# Patient Record
Sex: Female | Born: 1945 | Race: White | Hispanic: No | Marital: Single | Smoking: Former smoker
Health system: Southern US, Community
[De-identification: ages and names within clinical notes are randomized; demographics above are authoritative.]

## PROBLEM LIST (undated history)

## (undated) DIAGNOSIS — I509 Heart failure, unspecified: Secondary | ICD-10-CM

## (undated) DIAGNOSIS — J45909 Unspecified asthma, uncomplicated: Secondary | ICD-10-CM

## (undated) DIAGNOSIS — I4891 Unspecified atrial fibrillation: Secondary | ICD-10-CM

## (undated) DIAGNOSIS — I25708 Atherosclerosis of coronary artery bypass graft(s), unspecified, with other forms of angina pectoris: Secondary | ICD-10-CM

## (undated) HISTORY — PX: CORONARY ARTERY BYPASS GRAFT: SHX141

---

## 2017-12-09 ENCOUNTER — Inpatient Hospital Stay (HOSPITAL_BASED_OUTPATIENT_CLINIC_OR_DEPARTMENT_OTHER)
Admission: EM | Admit: 2017-12-09 | Discharge: 2017-12-13 | DRG: 308 | Disposition: A | Discharge status: Home / Self Care | Payer: Medicare Other | Attending: Internal Medicine | Admitting: Internal Medicine

## 2017-12-09 ENCOUNTER — Inpatient Hospital Stay (HOSPITAL_COMMUNITY): Payer: Medicare Other

## 2017-12-09 ENCOUNTER — Other Ambulatory Visit: Payer: Self-pay

## 2017-12-09 ENCOUNTER — Encounter (HOSPITAL_BASED_OUTPATIENT_CLINIC_OR_DEPARTMENT_OTHER): Payer: Self-pay | Admitting: Adult Health

## 2017-12-09 ENCOUNTER — Emergency Department (HOSPITAL_BASED_OUTPATIENT_CLINIC_OR_DEPARTMENT_OTHER): Payer: Medicare Other

## 2017-12-09 DIAGNOSIS — I5033 Acute on chronic diastolic (congestive) heart failure: Secondary | ICD-10-CM | POA: Diagnosis present

## 2017-12-09 DIAGNOSIS — K219 Gastro-esophageal reflux disease without esophagitis: Secondary | ICD-10-CM | POA: Diagnosis present

## 2017-12-09 DIAGNOSIS — Z91012 Allergy to eggs: Secondary | ICD-10-CM

## 2017-12-09 DIAGNOSIS — Z7982 Long term (current) use of aspirin: Secondary | ICD-10-CM

## 2017-12-09 DIAGNOSIS — E669 Obesity, unspecified: Secondary | ICD-10-CM | POA: Diagnosis present

## 2017-12-09 DIAGNOSIS — I4891 Unspecified atrial fibrillation: Secondary | ICD-10-CM | POA: Diagnosis not present

## 2017-12-09 DIAGNOSIS — I480 Paroxysmal atrial fibrillation: Principal | ICD-10-CM | POA: Diagnosis present

## 2017-12-09 DIAGNOSIS — E876 Hypokalemia: Secondary | ICD-10-CM | POA: Diagnosis present

## 2017-12-09 DIAGNOSIS — Z820 Family history of epilepsy and other diseases of the nervous system: Secondary | ICD-10-CM

## 2017-12-09 DIAGNOSIS — R000 Tachycardia, unspecified: Secondary | ICD-10-CM | POA: Diagnosis present

## 2017-12-09 DIAGNOSIS — J40 Bronchitis, not specified as acute or chronic: Secondary | ICD-10-CM | POA: Diagnosis present

## 2017-12-09 DIAGNOSIS — E785 Hyperlipidemia, unspecified: Secondary | ICD-10-CM | POA: Diagnosis present

## 2017-12-09 DIAGNOSIS — Z951 Presence of aortocoronary bypass graft: Secondary | ICD-10-CM | POA: Diagnosis not present

## 2017-12-09 DIAGNOSIS — R05 Cough: Secondary | ICD-10-CM | POA: Diagnosis present

## 2017-12-09 DIAGNOSIS — J4540 Moderate persistent asthma, uncomplicated: Secondary | ICD-10-CM | POA: Diagnosis present

## 2017-12-09 DIAGNOSIS — R7303 Prediabetes: Secondary | ICD-10-CM | POA: Diagnosis present

## 2017-12-09 DIAGNOSIS — I130 Hypertensive heart and chronic kidney disease with heart failure and stage 1 through stage 4 chronic kidney disease, or unspecified chronic kidney disease: Secondary | ICD-10-CM | POA: Diagnosis present

## 2017-12-09 DIAGNOSIS — G4733 Obstructive sleep apnea (adult) (pediatric): Secondary | ICD-10-CM | POA: Diagnosis present

## 2017-12-09 DIAGNOSIS — R739 Hyperglycemia, unspecified: Secondary | ICD-10-CM | POA: Diagnosis present

## 2017-12-09 DIAGNOSIS — R51 Headache: Secondary | ICD-10-CM

## 2017-12-09 DIAGNOSIS — I083 Combined rheumatic disorders of mitral, aortic and tricuspid valves: Secondary | ICD-10-CM | POA: Diagnosis present

## 2017-12-09 DIAGNOSIS — Z79899 Other long term (current) drug therapy: Secondary | ICD-10-CM

## 2017-12-09 DIAGNOSIS — F419 Anxiety disorder, unspecified: Secondary | ICD-10-CM | POA: Diagnosis present

## 2017-12-09 DIAGNOSIS — R748 Abnormal levels of other serum enzymes: Secondary | ICD-10-CM | POA: Diagnosis not present

## 2017-12-09 DIAGNOSIS — F0390 Unspecified dementia without behavioral disturbance: Secondary | ICD-10-CM | POA: Diagnosis present

## 2017-12-09 DIAGNOSIS — F329 Major depressive disorder, single episode, unspecified: Secondary | ICD-10-CM | POA: Diagnosis present

## 2017-12-09 DIAGNOSIS — K8020 Calculus of gallbladder without cholecystitis without obstruction: Secondary | ICD-10-CM | POA: Diagnosis present

## 2017-12-09 DIAGNOSIS — R058 Other specified cough: Secondary | ICD-10-CM

## 2017-12-09 DIAGNOSIS — N183 Chronic kidney disease, stage 3 (moderate): Secondary | ICD-10-CM | POA: Diagnosis present

## 2017-12-09 DIAGNOSIS — Z8249 Family history of ischemic heart disease and other diseases of the circulatory system: Secondary | ICD-10-CM

## 2017-12-09 DIAGNOSIS — R7989 Other specified abnormal findings of blood chemistry: Secondary | ICD-10-CM | POA: Diagnosis present

## 2017-12-09 DIAGNOSIS — R0789 Other chest pain: Secondary | ICD-10-CM | POA: Diagnosis not present

## 2017-12-09 DIAGNOSIS — Z6833 Body mass index (BMI) 33.0-33.9, adult: Secondary | ICD-10-CM | POA: Diagnosis not present

## 2017-12-09 DIAGNOSIS — I2510 Atherosclerotic heart disease of native coronary artery without angina pectoris: Secondary | ICD-10-CM | POA: Diagnosis present

## 2017-12-09 DIAGNOSIS — R0600 Dyspnea, unspecified: Secondary | ICD-10-CM

## 2017-12-09 DIAGNOSIS — I25810 Atherosclerosis of coronary artery bypass graft(s) without angina pectoris: Secondary | ICD-10-CM | POA: Diagnosis present

## 2017-12-09 DIAGNOSIS — Z87891 Personal history of nicotine dependence: Secondary | ICD-10-CM

## 2017-12-09 DIAGNOSIS — R519 Headache, unspecified: Secondary | ICD-10-CM | POA: Diagnosis present

## 2017-12-09 DIAGNOSIS — J45909 Unspecified asthma, uncomplicated: Secondary | ICD-10-CM | POA: Diagnosis present

## 2017-12-09 DIAGNOSIS — I340 Nonrheumatic mitral (valve) insufficiency: Secondary | ICD-10-CM | POA: Diagnosis not present

## 2017-12-09 DIAGNOSIS — Z7901 Long term (current) use of anticoagulants: Secondary | ICD-10-CM

## 2017-12-09 HISTORY — DX: Atherosclerosis of coronary artery bypass graft(s), unspecified, with other forms of angina pectoris: I25.708

## 2017-12-09 HISTORY — DX: Unspecified asthma, uncomplicated: J45.909

## 2017-12-09 HISTORY — DX: Heart failure, unspecified: I50.9

## 2017-12-09 HISTORY — DX: Unspecified atrial fibrillation: I48.91

## 2017-12-09 LAB — COMPREHENSIVE METABOLIC PANEL WITH GFR
ALT: 43 U/L (ref 14–54)
AST: 52 U/L — ABNORMAL HIGH (ref 15–41)
Albumin: 4.1 g/dL (ref 3.5–5.0)
Alkaline Phosphatase: 150 U/L — ABNORMAL HIGH (ref 38–126)
Anion gap: 10 (ref 5–15)
BUN: 11 mg/dL (ref 6–20)
CO2: 22 mmol/L (ref 22–32)
Calcium: 9.6 mg/dL (ref 8.9–10.3)
Chloride: 105 mmol/L (ref 101–111)
Creatinine, Ser: 0.85 mg/dL (ref 0.44–1.00)
GFR calc Af Amer: >60 mL/min (ref >60)
GFR calc non Af Amer: >60 mL/min (ref >60)
Glucose, Bld: 91 mg/dL (ref 65–99)
Potassium: 3.7 mmol/L (ref 3.5–5.1)
Sodium: 137 mmol/L (ref 135–145)
Total Bilirubin: 1.9 mg/dL — ABNORMAL HIGH (ref 0.3–1.2)
Total Protein: 7.4 g/dL (ref 6.5–8.1)

## 2017-12-09 LAB — TROPONIN I
Troponin I: <0.03 ng/mL (ref <0.03)
Troponin I: <0.03 ng/mL (ref <0.03)

## 2017-12-09 LAB — CBC
HCT: 41.3 % (ref 36.0–46.0)
Hemoglobin: 13.9 g/dL (ref 12.0–15.0)
MCH: 27.8 pg (ref 26.0–34.0)
MCHC: 33.7 g/dL (ref 30.0–36.0)
MCV: 82.6 fL (ref 78.0–100.0)
Platelets: 306 K/uL (ref 150–400)
RBC: 5 MIL/uL (ref 3.87–5.11)
RDW: 18.9 % — ABNORMAL HIGH (ref 11.5–15.5)
WBC: 14.7 K/uL — ABNORMAL HIGH (ref 4.0–10.5)

## 2017-12-09 LAB — PROTIME-INR
INR: 1.12
Prothrombin Time: 14.3 s (ref 11.4–15.2)

## 2017-12-09 LAB — MAGNESIUM: Magnesium: 1.9 mg/dL (ref 1.7–2.4)

## 2017-12-09 LAB — BRAIN NATRIURETIC PEPTIDE: B NATRIURETIC PEPTIDE 5: 156 pg/mL — AB (ref 0.0–100.0)

## 2017-12-09 LAB — D-DIMER, QUANTITATIVE (NOT AT ARMC): D DIMER QUANT: 0.48 ug{FEU}/mL (ref 0.00–0.50)

## 2017-12-09 LAB — HEMOGLOBIN A1C
HEMOGLOBIN A1C: 6 % — AB (ref 4.8–5.6)
MEAN PLASMA GLUCOSE: 125.5 mg/dL

## 2017-12-09 MED ORDER — PAROXETINE HCL 10 MG PO TABS
10.0000 mg | ORAL_TABLET | Freq: Every day | ORAL | Status: DC
  Administered 2017-12-10 – 2017-12-13 (×4): 10 mg via ORAL
  Filled 2017-12-09 (×4): qty 1

## 2017-12-09 MED ORDER — MEMANTINE HCL ER 7 MG PO CP24
7.0000 mg | ORAL_CAPSULE | Freq: Every day | ORAL | Status: DC
  Administered 2017-12-10 – 2017-12-13 (×4): 7 mg via ORAL
  Filled 2017-12-09 (×4): qty 1

## 2017-12-09 MED ORDER — ONDANSETRON HCL 4 MG/2ML IJ SOLN
4.0000 mg | Freq: Four times a day (QID) | INTRAMUSCULAR | Status: DC | PRN
  Administered 2017-12-09: 4 mg via INTRAVENOUS
  Filled 2017-12-09: qty 2

## 2017-12-09 MED ORDER — FAMOTIDINE 20 MG PO TABS
20.0000 mg | ORAL_TABLET | Freq: Every day | ORAL | Status: DC
  Administered 2017-12-10 – 2017-12-13 (×4): 20 mg via ORAL
  Filled 2017-12-09 (×4): qty 1

## 2017-12-09 MED ORDER — ACETAMINOPHEN 650 MG RE SUPP: 650.0000 mg | Freq: Four times a day (QID) | RECTAL | Status: DC | PRN

## 2017-12-09 MED ORDER — LORATADINE 10 MG PO TABS
10.0000 mg | ORAL_TABLET | Freq: Every day | ORAL | Status: DC
  Administered 2017-12-10 – 2017-12-13 (×4): 10 mg via ORAL
  Filled 2017-12-09 (×4): qty 1

## 2017-12-09 MED ORDER — AZITHROMYCIN 500 MG IV SOLR
500.0000 mg | INTRAVENOUS | Status: DC
  Administered 2017-12-10 – 2017-12-11 (×2): 500 mg via INTRAVENOUS
  Filled 2017-12-09 (×2): qty 500

## 2017-12-09 MED ORDER — FUROSEMIDE 40 MG PO TABS
40.0000 mg | ORAL_TABLET | Freq: Two times a day (BID) | ORAL | Status: DC
  Administered 2017-12-09 – 2017-12-11 (×4): 40 mg via ORAL
  Filled 2017-12-09 (×4): qty 1

## 2017-12-09 MED ORDER — ZOLPIDEM TARTRATE 5 MG PO TABS
5.0000 mg | ORAL_TABLET | Freq: Once | ORAL | Status: AC
  Administered 2017-12-09: 5 mg via ORAL
  Filled 2017-12-09: qty 1

## 2017-12-09 MED ORDER — DEXTROSE 5 % IV SOLN
5.0000 mg/h | INTRAVENOUS | Status: DC
  Administered 2017-12-10: 5 mg/h via INTRAVENOUS
  Filled 2017-12-09 (×2): qty 100

## 2017-12-09 MED ORDER — LORATADINE 10 MG PO TABS: 10.0000 mg | ORAL_TABLET | Freq: Every day | ORAL | Status: DC

## 2017-12-09 MED ORDER — METOPROLOL TARTRATE 50 MG PO TABS
50.0000 mg | ORAL_TABLET | Freq: Two times a day (BID) | ORAL | Status: DC
  Administered 2017-12-09 – 2017-12-13 (×8): 50 mg via ORAL
  Filled 2017-12-09 (×8): qty 1

## 2017-12-09 MED ORDER — ACETAMINOPHEN 325 MG PO TABS
650.0000 mg | ORAL_TABLET | Freq: Four times a day (QID) | ORAL | Status: DC | PRN
  Administered 2017-12-09 – 2017-12-11 (×2): 650 mg via ORAL
  Filled 2017-12-09 (×2): qty 2

## 2017-12-09 MED ORDER — ATORVASTATIN CALCIUM 80 MG PO TABS
80.0000 mg | ORAL_TABLET | Freq: Every day | ORAL | Status: DC
  Administered 2017-12-09 – 2017-12-10 (×2): 80 mg via ORAL
  Filled 2017-12-09 (×2): qty 1

## 2017-12-09 MED ORDER — APIXABAN 2.5 MG PO TABS
2.5000 mg | ORAL_TABLET | Freq: Two times a day (BID) | ORAL | Status: DC
  Administered 2017-12-09 – 2017-12-10 (×3): 2.5 mg via ORAL
  Filled 2017-12-09 (×3): qty 1

## 2017-12-09 MED ORDER — SODIUM CHLORIDE 0.9 % IV SOLN
1.0000 g | INTRAVENOUS | Status: DC
  Administered 2017-12-10 – 2017-12-11 (×2): 1 g via INTRAVENOUS
  Filled 2017-12-09 (×3): qty 10

## 2017-12-09 MED ORDER — AZITHROMYCIN 500 MG IV SOLR
INTRAVENOUS | Status: AC
  Filled 2017-12-09: qty 500

## 2017-12-09 MED ORDER — MONTELUKAST SODIUM 10 MG PO TABS
10.0000 mg | ORAL_TABLET | Freq: Every day | ORAL | Status: DC
  Administered 2017-12-09 – 2017-12-13 (×5): 10 mg via ORAL
  Filled 2017-12-09 (×5): qty 1

## 2017-12-09 MED ORDER — DOCUSATE SODIUM 100 MG PO CAPS
100.0000 mg | ORAL_CAPSULE | Freq: Two times a day (BID) | ORAL | Status: DC
  Administered 2017-12-09 – 2017-12-13 (×8): 100 mg via ORAL
  Filled 2017-12-09 (×8): qty 1

## 2017-12-09 MED ORDER — FLUTICASONE FUROATE-VILANTEROL 100-25 MCG/INH IN AEPB
1.0000 | INHALATION_SPRAY | Freq: Every day | RESPIRATORY_TRACT | Status: DC
  Administered 2017-12-10 – 2017-12-13 (×4): 1 via RESPIRATORY_TRACT
  Filled 2017-12-09 (×2): qty 28

## 2017-12-09 MED ORDER — ONDANSETRON HCL 4 MG PO TABS: 4.0000 mg | ORAL_TABLET | Freq: Four times a day (QID) | ORAL | Status: DC | PRN

## 2017-12-09 MED ORDER — SODIUM CHLORIDE 0.9 % IV SOLN
500.0000 mg | Freq: Once | INTRAVENOUS | Status: AC
  Administered 2017-12-09: 500 mg via INTRAVENOUS
  Filled 2017-12-09: qty 500

## 2017-12-09 MED ORDER — LEVALBUTEROL HCL 0.63 MG/3ML IN NEBU: 0.6300 mg | INHALATION_SOLUTION | Freq: Four times a day (QID) | RESPIRATORY_TRACT | Status: DC | PRN

## 2017-12-09 MED ORDER — GUAIFENESIN-DM 100-10 MG/5ML PO SYRP
5.0000 mL | ORAL_SOLUTION | ORAL | Status: DC | PRN
  Administered 2017-12-09 – 2017-12-10 (×4): 5 mL via ORAL
  Filled 2017-12-09 (×4): qty 5

## 2017-12-09 MED ORDER — SODIUM CHLORIDE 0.9 % IV SOLN
1.0000 g | Freq: Once | INTRAVENOUS | Status: AC
  Administered 2017-12-09: 1 g via INTRAVENOUS
  Filled 2017-12-09: qty 10

## 2017-12-09 MED ORDER — DILTIAZEM LOAD VIA INFUSION
10.0000 mg | Freq: Once | INTRAVENOUS | Status: AC
  Administered 2017-12-09: 10 mg via INTRAVENOUS
  Filled 2017-12-09: qty 10

## 2017-12-09 MED ORDER — ASPIRIN EC 81 MG PO TBEC
81.0000 mg | DELAYED_RELEASE_TABLET | Freq: Every day | ORAL | Status: DC
  Administered 2017-12-10 – 2017-12-13 (×4): 81 mg via ORAL
  Filled 2017-12-09 (×4): qty 1

## 2017-12-09 MED ORDER — DILTIAZEM LOAD VIA INFUSION
15.0000 mg | Freq: Once | INTRAVENOUS | Status: AC
  Administered 2017-12-09: 15 mg via INTRAVENOUS
  Filled 2017-12-09: qty 15

## 2017-12-09 NOTE — Care Management Note (Signed)
Pleasant 72 year old female visiting from [place] presents with chills cough and shortness of breath to outside urgent care center.  She was noted to be in atrial fibrillation with rapid ventricular response and sent to [facility] emergency department.  Her past medical history is significant for CABG in the fall 2018 due to coronary artery disease it was complicated by atrial fibrillation postoperatively.  Since that time she has not had any atrial fibrillation but is anticoagulated on warfarin we believe however her medication list shows both Eliquis and warfarin and we are checking into this.  Emergency department she was found to be in atrial fibrillation with rapid ventricular response in the 140s to 150s.  She received a bolus of Cardizem at 15 mg and a drip at 5 however she did not [place] currently respond her heart rate remained in the 110s 120s and upon conversation with me the emergency department physician was increasing her drip and giving her an additional 10 mg IV Cardizem bolus.  She will be transferred here to a stepdown bed for further evaluation and management.  Please note that she is having cough and congestion and she has an elevated white blood cell count.  She is running a low-grade fever of 99.8.  Chest x-ray does not show any pneumonia but there is enough of a concern that antibiotics are being started in the emergency department.

## 2017-12-09 NOTE — ED Triage Notes (Signed)
Pt is a heart patient with HX of open heart surgery, she had 6 CABG in Novemebr. She has hx of Afib and is in Afib with RVR of high 170.  she reports a worsening cough and feeling hot over past week. PRoductive cough noted with phlegm. Denies increased swelling or weight. Hot to touch.

## 2017-12-09 NOTE — Progress Notes (Signed)
ANTICOAGULATION CONSULT NOTE - Initial Consult  Pharmacy Consult for apixaban Indication: atrial fibrillation  Allergies  Allergen Reactions  . Eggs Or Egg-Derived Products     Patient Measurements: Height:  (157.5 cm) Weight: 186 lb 1.1 oz (84.4 kg) IBW/kg (Calculated) : 50.1  Vital Signs: Temp: 97.4 F (36.3 C) (05/02 2009) Temp Source: Oral (05/02 2009) BP: 131/74 (05/02 2009) Pulse Rate: 113 (05/02 2009)  Labs: Recent Labs    12/09/17 1453  HGB 13.9  HCT 41.3  PLT 306  LABPROT 14.3  INR 1.12  CREATININE 0.85  TROPONINI <0.03    Estimated Creatinine Clearance: 58.5 mL/min (by C-G formula based on SCr of 0.85 mg/dL).   Medical History: Past Medical History:  Diagnosis Date  . Asthma   . 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 Atrial fibrillation (HCC)   . Congestive heart failure (CHF) (HCC)     Medications:  Medications Prior to Admission  Medication Sig Dispense Refill Last Dose  . apixaban (ELIQUIS) 2.5 MG TABS tablet Take by mouth 2 (two) times daily.    12/09/2017 at Unknown time  . aspirin EC 81 MG tablet Take 81 mg by mouth daily.   12/09/2017 at Unknown time  . atorvastatin (LIPITOR) 80 MG tablet ONE TABLET BY MOUTH IN THE EVENING  0 12/08/2017 at Unknown time  . BREO ELLIPTA 100-25 MCG/INH AEPB 1 puff daily.    12/09/2017 at Unknown time  . cetirizine (ZYRTEC) 10 MG tablet Take 10 mg by mouth daily.   12/09/2017 at Unknown time  . docusate sodium (COLACE) 100 MG capsule Take 100 mg by mouth 2 (two) times daily.   12/09/2017 at Unknown time  . famotidine (PEPCID) 20 MG tablet Take 20 mg by mouth daily.  0 12/09/2017 at Unknown time  . fexofenadine (ALLEGRA) 180 MG tablet Take 180 mg by mouth as needed for allergies or rhinitis.     . furosemide (LASIX) 40 MG tablet ONE TABLET BY MOUTH TWICE A DAY  0 12/09/2017 at Unknown time  . loperamide (IMODIUM) 2 MG capsule Take 2 mg by mouth as needed for diarrhea or loose stools.     . memantine  (NAMENDA XR) 7 MG CP24 24 hr capsule ONE CAPSULE BY MOUTH DAILY FOR 7 DAYS THEN INCREASE TO TWO CAPSULES DAILY  2 12/09/2017 at Unknown time  . metoprolol tartrate (LOPRESSOR) 50 MG tablet ONE TABLET BY MOUTH TWICE A DAY  0 12/09/2017 at Unknown time  . montelukast (SINGULAIR) 10 MG tablet Take 10 mg by mouth daily.  0 12/08/2017 at Unknown time  . PARoxetine (PAXIL) 10 MG tablet Take 10 mg by mouth daily.  0 12/09/2017 at Unknown time  . albuterol (PROVENTIL) (2.5 MG/3ML) 0.083% nebulizer solution ONE UNIT DOSE VIA NEBULIZER EVERY SIX HOURS AS NEEDED  6     Assessment: 72 yoF with history of AFib on apixaban PTA. Pt is < 80yo, Weight > 60 kg, SCr < 1.5. Unsure why she is on reduced dose due to lack of history. Will need to follow up if this is appropriate dosing for this patient. CBC wnl. No bleeding noted. Looks like patient may have also been on warfarin in the past.  Goal of Therapy:  Anticoagulation Monitor platelets by anticoagulation protocol: Yes   Plan:  Restart apixaban 2.5 mg PO BID Monitor CBC, renal function, and for signs and symptoms of bleeding Follow up appropriate dosing   Thank you for allowing [place] to participate  in this patients care.  Signe Colt, PharmD Clinical phone for 12/09/2017 from 3:30-10:30p: [phone] If after 10:30p, please call main pharmacy at: [phone] 12/09/2017 9:33 PM

## 2017-12-09 NOTE — ED Notes (Signed)
Pt. Reports she is from a care facility in [place] that she is given her meds on a time frame and she does not know the names of.  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 Pt in no distress and HR has come down with rhythm still A fib

## 2017-12-09 NOTE — ED Provider Notes (Addendum)
[facility] EMERGENCY DEPARTMENT Provider Note   CSN: [id number] Arrival date & time: 12/09/17  1410     History   Chief Complaint Chief Complaint  Patient presents with  . Chest Pain    HPI Shannon Sellers is a 72 y.o. female.  HPI Patient is a 72 year old female with a history of coronary artery bypass grafting who presents to the emergency department with productive cough and chills over the past 4 to 5 days.  She reports shortness of breath with exertion.  She denies new orthopnea.  She has a history of paroxysmal A. fib.  She is on chronic anticoagulation.  She lives in [place] and is currently visiting family locally.  She was seen in urgent care today and sent to the ER for further evaluation when she was found to be in rapid A. fib.  Patient denies palpitations at this time.  She does have palpitations from time to time.  She states is been many months since she has been in atrial fibrillation.   Past Medical History:  Diagnosis Date  . Asthma   . 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 Atrial fibrillation (HCC)   . Congestive heart failure (CHF) (HCC)     There are no active problems to display for this patient.   Past Surgical History:  Procedure Laterality Date  . CORONARY ARTERY BYPASS GRAFT     6 vessels     OB History   None      Home Medications    Prior to Admission medications   Medication Sig Start Date End Date Taking? Authorizing Provider  albuterol (PROVENTIL) (2.5 MG/3ML) 0.083% nebulizer solution ONE UNIT DOSE VIA NEBULIZER EVERY SIX HOURS AS NEEDED 09/08/17   [provider]  apixaban (ELIQUIS) 2.5 MG TABS tablet Take by mouth.    [provider]  atorvastatin (LIPITOR) 80 MG tablet ONE TABLET BY MOUTH IN THE EVENING 11/17/17   [provider]  BREO ELLIPTA 100-25 MCG/INH AEPB  12/07/17   [provider]  ELIQUIS 2.5 MG TABS tablet ONE TABLET BY MOUTH TWICE A DAY 12/07/17    [provider]  famotidine (PEPCID) 20 MG tablet Take 20 mg by mouth daily. 11/17/17   [provider]  furosemide (LASIX) 40 MG tablet ONE TABLET BY MOUTH TWICE A DAY 11/17/17   [provider]  memantine (NAMENDA XR) 7 MG CP24 24 hr capsule ONE CAPSULE BY MOUTH DAILY FOR 7 DAYS THEN INCREASE TO TWO CAPSULES DAILY 11/19/17   [provider]  methylPREDNISolone (MEDROL DOSEPAK) 4 MG TBPK tablet See admin instructions. 09/18/17   [provider]  metoprolol tartrate (LOPRESSOR) 50 MG tablet ONE TABLET BY MOUTH TWICE A DAY 11/17/17   [provider]  montelukast (SINGULAIR) 10 MG tablet Take 10 mg by mouth daily. 11/17/17   [provider]  PARoxetine (PAXIL) 10 MG tablet Take 10 mg by mouth daily. 11/17/17   [provider]    Family History History reviewed. No pertinent family history.  Social History Social History   Tobacco Use  . Smoking status: Not on file  Substance Use Topics  . Alcohol use: Not on file  . Drug use: Not on file     Allergies   Eggs or egg-derived products   Review of Systems Review of Systems  All other systems reviewed and are negative.    Physical Exam Updated Vital Signs BP (!) 121/99   Pulse 78  Temp 99.8 F (37.7 C) (Oral)   Resp 17   SpO2 98%   Physical Exam  Constitutional: She is oriented to person, place, and time. She appears well-developed and well-nourished. No distress.  HENT:  Head: Normocephalic and atraumatic.  Eyes: EOM are normal.  Neck: Normal range of motion.  Cardiovascular:  Tachycardic.  Irregularly irregular.  Pulmonary/Chest: Effort normal and breath sounds normal.  Abdominal: Soft. She exhibits no distension. There is no tenderness.  Musculoskeletal: Normal range of motion. She exhibits edema.  Neurological: She is alert and oriented to person, place, and time.  Skin: Skin is warm and dry.  Psychiatric: She has a normal mood and affect. Judgment  normal.  Nursing note and vitals reviewed.    ED Treatments / Results  Labs (all labs ordered are listed, but only abnormal results are displayed) Labs Reviewed  CBC - Abnormal; Notable for the following components:      Result Value   WBC 14.7 (*)    RDW 18.9 (*)    All other components within normal limits  COMPREHENSIVE METABOLIC PANEL - Abnormal; Notable for the following components:   AST 52 (*)    Alkaline Phosphatase 150 (*)    Total Bilirubin 1.9 (*)    All other components within normal limits  BRAIN NATRIURETIC PEPTIDE - Abnormal; Notable for the following components:   B Natriuretic Peptide 156.0 (*)    All other components within normal limits  TROPONIN I  PROTIME-INR    EKG EKG Interpretation  Date/Time:  Thursday Dec 09 2017 14:16:33 EDT Ventricular Rate:  145 PR Interval:    QRS Duration: 80 QT Interval:  302 QTC Calculation: 469 R Axis:   72 Text Interpretation:  Atrial fibrillation with rapid ventricular response with premature ventricular or aberrantly conducted complexes Nonspecific ST abnormality Abnormal ECG No old tracing to compare Confirmed by Azalia Bilis ([id number]) on 12/09/2017 3:42:13 PM   Radiology Dg Chest Portable 1 View  Result Date: 12/09/2017 CLINICAL DATA:  Productive cough EXAM: PORTABLE CHEST 1 VIEW COMPARISON:  None. FINDINGS: Mild cardiomegaly. Normal vascularity. Postop changes from CABG. No pneumothorax or pleural effusion. Low volumes. IMPRESSION: Cardiomegaly without decompensation. Electronically Signed   By: Jolaine Click M.D.   On: 12/09/2017 15:20    Procedures .Critical Care Performed by: Azalia Bilis, MD Authorized by: Azalia Bilis, MD     CRITICAL CARE Performed by: Azalia Bilis Total critical care time: 32 minutes Critical care time was exclusive of separately billable procedures and treating other patients. Critical care was necessary to treat or prevent imminent or life-threatening deterioration. Critical care  was time spent personally by me on the following activities: development of treatment plan with patient and/or surrogate as well as nursing, discussions with consultants, evaluation of patient's response to treatment, examination of patient, obtaining history from patient or surrogate, ordering and performing treatments and interventions, ordering and review of laboratory studies, ordering and review of radiographic studies, pulse oximetry and re-evaluation of patient's condition.   Medications Ordered in ED Medications  diltiazem (CARDIZEM) 1 mg/mL load via infusion 15 mg (15 mg Intravenous Bolus from Bag 12/09/17 1513)    And  diltiazem (CARDIZEM) 100 mg in dextrose 5 % 100 mL (1 mg/mL) infusion ( Intravenous Rate/Dose Change 12/09/17 1515)  diltiazem (CARDIZEM) 1 mg/mL load via infusion 10 mg (has no administration in time range)  cefTRIAXone (ROCEPHIN) 1 g in sodium chloride 0.9 % 100 mL IVPB (has no administration in time range)  azithromycin (ZITHROMAX) 500  mg in sodium chloride 0.9 % 250 mL IVPB (has no administration in time range)     Initial Impression / Assessment and Plan / ED Course  I have reviewed the triage vital signs and the nursing notes.  Pertinent labs & imaging results that were available during my care of the patient were reviewed by me and considered in my medical decision making (see chart for details).     Patient with rapid atrial fibrillation on arrival to the emergency department.  History of A. fib.  Subtherapeutic INR at 1.12 today.  She also has listed on her medication list Eliquis but she is unsure if she takes Coumadin or Eliquis for anticoagulation.  Remains in rapid A. fib despite Cardizem drip.  We will titrate her Cardizem drip at this time.  Chest pain-free.  She is had productive cough and chills and has an oral temp of 99.8.  Will obtain a rectal temp.  This may represent developing pneumonia.  Patient be covered with antibiotics.  Admit to the  hospital.  Final Clinical Impressions(s) / ED Diagnoses   Final diagnoses:  Atrial fibrillation with tachycardic ventricular rate (HCC)  Dyspnea, unspecified type  Productive cough    ED Discharge Orders    None       Azalia Bilis, MD 12/09/17 1544    Azalia Bilis, MD 12/09/17 [phone]

## 2017-12-09 NOTE — H&P (Signed)
History and Physical    Shannon Sellers AVW:[id number] DOB: [birth year] DOA: 12/09/2017  PCP: Patient, No Pcp Per  Patient coming from: Home.  Chief Complaint: Chest pressure palpitations shortness of breath.  HPI: Shannon Sellers is a 72 y.o. female with history of CAD status post CABG in November 2018 at [place] who is visiting [place] and has been in [place] for last 2 weeks.  Over the last 2 weeks patient has been having flulike symptoms with cough and sinus pressure and sometimes headache.  Patient also has been progressively getting short of breath with some chest pressure off and on with palpitations.  Given the symptoms patient came to the ER.  ED Course: In the ER patient is found to be in A. fib with RVR.  Patient was started on Cardizem infusion.  Patient was mildly febrile with leukocytosis chest x-ray was unremarkable.  Given her respiratory tract symptoms patient was empirically started on antibiotics for possible pneumonia.  Patient admitted for further management.  Review of Systems: As per HPI, rest all negative.   Past Medical History:  Diagnosis Date  . Asthma   . 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 Atrial fibrillation (HCC)   . Congestive heart failure (CHF) [facility])     Past Surgical History:  Procedure Laterality Date  . CORONARY ARTERY BYPASS GRAFT     6 vessels     reports that she has quit smoking. She has never used smokeless tobacco. She reports that she drank alcohol. She reports that she does not use drugs.  Allergies  Allergen Reactions  . Eggs Or Egg-Derived Products     Family History  Problem Relation Age of Onset  . CAD Mother   . Alzheimer's disease Brother     Prior to Admission medications   Medication Sig Start Date End Date Taking? Authorizing Provider  apixaban (ELIQUIS) 2.5 MG TABS tablet Take by mouth 2 (two) times daily.    Yes [provider]  aspirin EC 81 MG tablet Take 81 mg by mouth  daily.   Yes [provider]  atorvastatin (LIPITOR) 80 MG tablet ONE TABLET BY MOUTH IN THE EVENING 11/17/17  Yes [provider]  BREO ELLIPTA 100-25 MCG/INH AEPB 1 puff daily.  12/07/17  Yes [provider]  cetirizine (ZYRTEC) 10 MG tablet Take 10 mg by mouth daily.   Yes [provider]  docusate sodium (COLACE) 100 MG capsule Take 100 mg by mouth 2 (two) times daily.   Yes [provider]  famotidine (PEPCID) 20 MG tablet Take 20 mg by mouth daily. 11/17/17  Yes [provider]  fexofenadine (ALLEGRA) 180 MG tablet Take 180 mg by mouth as needed for allergies or rhinitis.   Yes [provider]  furosemide (LASIX) 40 MG tablet ONE TABLET BY MOUTH TWICE A DAY 11/17/17  Yes [provider]  loperamide (IMODIUM) 2 MG capsule Take 2 mg by mouth as needed for diarrhea or loose stools.   Yes [provider]  memantine (NAMENDA XR) 7 MG CP24 24 hr capsule ONE CAPSULE BY MOUTH DAILY FOR 7 DAYS THEN INCREASE TO TWO CAPSULES DAILY 11/19/17  Yes [provider]  metoprolol tartrate (LOPRESSOR) 50 MG tablet ONE TABLET BY MOUTH TWICE A DAY 11/17/17  Yes [provider]  montelukast (SINGULAIR) 10 MG tablet Take 10 mg by mouth daily. 11/17/17  Yes [provider]  PARoxetine (PAXIL) 10 MG tablet Take 10 mg by mouth  daily. 11/17/17  Yes [provider]  albuterol (PROVENTIL) (2.5 MG/3ML) 0.083% nebulizer solution ONE UNIT DOSE VIA NEBULIZER EVERY SIX HOURS AS NEEDED 09/08/17   [provider]    Physical Exam: Vitals:   12/09/17 1730 12/09/17 1745 12/09/17 1859 12/09/17 2009  BP: 121/64 122/72  131/74  Pulse: 95 91 (!) 104 (!) 113  Resp: 13 (!) Temp:   99.2 F (37.3 C) (!) 97.4 F (36.3 C)  TempSrc:   Oral Oral  SpO2: 99% 100% 99% 96%  Weight:   84.4 kg (186 lb 1.1 oz)   Height:    (1.575 m)       Constitutional: Moderately built and nourished. Vitals:    12/09/17 1730 12/09/17 1745 12/09/17 1859 12/09/17 2009  BP: 121/64 122/72  131/74  Pulse: 95 91 (!) 104 (!) 113  Resp: 13 (!) Temp:   99.2 F (37.3 C) (!) 97.4 F (36.3 C)  TempSrc:   Oral Oral  SpO2: 99% 100% 99% 96%  Weight:   84.4 kg (186 lb 1.1 oz)   Height:    (1.575 m)    Eyes: Anicteric no pallor. ENMT: No discharge from the ears eyes nose or mouth. Neck: No mass palpated no JVD appreciated. Respiratory: No rhonchi or crepitations. Cardiovascular: S1-S2 heard no murmurs appreciated. Abdomen: Soft nontender bowel sounds present.  Musculoskeletal: No edema no joint effusion. Skin: No rash. Neurologic: Awake oriented to time place and person.  Moves all extremities. Psychiatric: Appears normal.   Labs on Admission: I have personally reviewed following labs and imaging studies  CBC: Recent Labs  Lab 12/09/17 1453  WBC 14.7*  HGB 13.9  HCT 41.3  MCV 82.6  PLT 306   Basic Metabolic Panel: Recent Labs  Lab 12/09/17 1453  NA 137  K 3.7  CL 105  CO2 22  GLUCOSE 91  BUN 11  CREATININE 0.85  CALCIUM 9.6   GFR: Estimated Creatinine Clearance: 58.5 mL/min (by C-G formula based on SCr of 0.85 mg/dL). Liver Function Tests: Recent Labs  Lab 12/09/17 1453  AST 52*  ALT 43  ALKPHOS 150*  BILITOT 1.9*  PROT 7.4  ALBUMIN 4.1   No results for input(s): LIPASE, AMYLASE in the last 168 hours. No results for input(s): AMMONIA in the last 168 hours. Coagulation Profile: Recent Labs  Lab 12/09/17 1453  INR 1.12   Cardiac Enzymes: Recent Labs  Lab 12/09/17 1453  TROPONINI <0.03   BNP (last 3 results) No results for input(s): PROBNP in the last 8760 hours. HbA1C: No results for input(s): HGBA1C in the last 72 hours. CBG: No results for input(s): GLUCAP in the last 168 hours. Lipid Profile: No results for input(s): CHOL, HDL, LDLCALC, TRIG, CHOLHDL, LDLDIRECT in the last 72 hours. Thyroid Function Tests: No results for input(s): TSH,  T4TOTAL, FREET4, T3FREE, THYROIDAB in the last 72 hours. Anemia Panel: No results for input(s): VITAMINB12, FOLATE, FERRITIN, TIBC, IRON, RETICCTPCT in the last 72 hours. Urine analysis: No results found for: COLORURINE, APPEARANCEUR, LABSPEC, PHURINE, GLUCOSEU, HGBUR, BILIRUBINUR, KETONESUR, PROTEINUR, UROBILINOGEN, NITRITE, LEUKOCYTESUR Sepsis Labs: (procalcitonin:4,lacticidven:4) )No results found for this or any previous visit (from the past 240 hour(s)).   Radiological Exams on Admission: Dg Chest Portable 1 View  Result Date: 12/09/2017 CLINICAL DATA:  Productive cough EXAM: PORTABLE CHEST 1 VIEW COMPARISON:  None. FINDINGS: Mild cardiomegaly. Normal vascularity. Postop changes from CABG. No pneumothorax or pleural effusion. Low volumes. IMPRESSION: Cardiomegaly  without decompensation. Electronically Signed   By: Jolaine Click M.D.   On: 12/09/2017 15:20    EKG: Independently reviewed.  A. fib with RVR.  I see that in the monitor old.  Assessment/Plan Active Problems:   Atrial fibrillation with RVR (HCC)   Asthma   CAD (coronary artery disease)   Headache   Dyspnea   Productive cough    1. A. fib with RVR -patient had postoperative A. fib after her CABG in November 2018 following which patient was briefly on anticoagulation was discontinued.  Patient presently is in A. fib with RVR given her chads 2 vasc score of at least 2 patient started on apixaban.  Patient on Cardizem infusion.  Patient is due to take her metoprolol.  We will closely observe and stepdown.  Check TSH cardiac markers 2D echo and d-dimer. 2. Fever and cough concerning for pneumonia patient is placed empiric antibiotics.  Check urine for Legionella strep antigen influenza PCR cultures. 3. CAD status post CABG denies any chest pain at this time did have some chest pain off and on last 2 weeks.  We will cycle cardiac markers check 2D echo patient is on aspirin Plavix statins and metoprolol. 4. Possible CHF  patient is on Lasix.  Check 2D echo. 5. Headache -patient states he has been having worsening headache last 2 to 3 days.  Will check CT head and sed rate. 6. History of asthma presently not wheezing. 7. Sleep apnea on CPAP. 8. 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   Will need to get patient's records from [facility].  For further history.  Charts not available in care everywhere.  DVT prophylaxis: Apixaban. Code Status: Full code. Family Communication: No family at the bedside. Disposition Plan: To be determined. Consults called: None. Admission status: Inpatient.   Eduard Clos MD [facility] Pager [phone].  If 7PM-7AM, please contact night-coverage [email or web address] Password [email or web address]  12/09/2017, 9:10 PM

## 2017-12-09 NOTE — Progress Notes (Signed)
Patient's home CPAP was set up at the bedside and is ready for use.  

## 2017-12-10 ENCOUNTER — Other Ambulatory Visit (HOSPITAL_COMMUNITY): Payer: Medicare Other

## 2017-12-10 LAB — URINALYSIS, ROUTINE W REFLEX MICROSCOPIC
BACTERIA UA: NONE SEEN
Bilirubin Urine: NEGATIVE
GLUCOSE, UA: NEGATIVE mg/dL
Hgb urine dipstick: NEGATIVE
KETONES UR: NEGATIVE mg/dL
Nitrite: NEGATIVE
PROTEIN: NEGATIVE mg/dL
Specific Gravity, Urine: 1.018 (ref 1.005–1.030)
pH: 6 (ref 5.0–8.0)

## 2017-12-10 LAB — COMPREHENSIVE METABOLIC PANEL
ALK PHOS: 134 U/L — AB (ref 38–126)
ALT: 41 U/L (ref 14–54)
AST: 49 U/L — AB (ref 15–41)
Albumin: 3.3 g/dL — ABNORMAL LOW (ref 3.5–5.0)
Anion gap: 8 (ref 5–15)
BILIRUBIN TOTAL: 1.7 mg/dL — AB (ref 0.3–1.2)
BUN: 9 mg/dL (ref 6–20)
CO2: 26 mmol/L (ref 22–32)
CREATININE: 1.02 mg/dL — AB (ref 0.44–1.00)
Calcium: 9.2 mg/dL (ref 8.9–10.3)
Chloride: 106 mmol/L (ref 101–111)
GFR calc Af Amer: >60 mL/min (ref >60)
GFR calc non Af Amer: 53 mL/min — ABNORMAL LOW (ref >60)
GLUCOSE: 106 mg/dL — AB (ref 65–99)
Potassium: 4.3 mmol/L (ref 3.5–5.1)
Sodium: 140 mmol/L (ref 135–145)
Total Protein: 6.4 g/dL — ABNORMAL LOW (ref 6.5–8.1)

## 2017-12-10 LAB — CBC
HEMATOCRIT: 38.9 % (ref 36.0–46.0)
Hemoglobin: 12.4 g/dL (ref 12.0–15.0)
MCH: 27.3 pg (ref 26.0–34.0)
MCHC: 31.9 g/dL (ref 30.0–36.0)
MCV: 85.5 fL (ref 78.0–100.0)
Platelets: 277 10*3/uL (ref 150–400)
RBC: 4.55 MIL/uL (ref 3.87–5.11)
RDW: 18.7 % — ABNORMAL HIGH (ref 11.5–15.5)
WBC: 11.7 10*3/uL — ABNORMAL HIGH (ref 4.0–10.5)

## 2017-12-10 LAB — INFLUENZA PANEL BY PCR (TYPE A & B)
Influenza A By PCR: NEGATIVE
Influenza B By PCR: NEGATIVE

## 2017-12-10 LAB — STREP PNEUMONIAE URINARY ANTIGEN: Strep Pneumo Urinary Antigen: NEGATIVE

## 2017-12-10 LAB — TROPONIN I
Troponin I: <0.03 ng/mL (ref <0.03)
Troponin I: <0.03 ng/mL (ref <0.03)

## 2017-12-10 MED ORDER — ACETAMINOPHEN-CODEINE 120-12 MG/5ML PO SOLN: 5.0000 mL | Freq: Four times a day (QID) | ORAL | Status: AC | PRN

## 2017-12-10 MED ORDER — CHLORHEXIDINE GLUCONATE 0.12 % MT SOLN
15.0000 mL | Freq: Two times a day (BID) | OROMUCOSAL | Status: DC
  Administered 2017-12-10 – 2017-12-13 (×5): 15 mL via OROMUCOSAL
  Filled 2017-12-10 (×6): qty 15

## 2017-12-10 MED ORDER — ORAL CARE MOUTH RINSE
15.0000 mL | Freq: Two times a day (BID) | OROMUCOSAL | Status: DC
  Administered 2017-12-12: 15 mL via OROMUCOSAL

## 2017-12-10 MED ORDER — DILTIAZEM HCL 60 MG PO TABS
30.0000 mg | ORAL_TABLET | Freq: Three times a day (TID) | ORAL | Status: DC
  Administered 2017-12-10 – 2017-12-11 (×3): 30 mg via ORAL
  Filled 2017-12-10 (×3): qty 1

## 2017-12-10 MED ORDER — APIXABAN 5 MG PO TABS
5.0000 mg | ORAL_TABLET | Freq: Two times a day (BID) | ORAL | Status: DC
  Administered 2017-12-11 – 2017-12-13 (×5): 5 mg via ORAL
  Filled 2017-12-10 (×5): qty 1

## 2017-12-10 NOTE — Progress Notes (Addendum)
PROGRESS NOTE  Shannon Sellers BJY:[id number] DOB: Jul 22, 1946 DOA: 12/09/2017 PCP: Patient, No Pcp Per  HPI/Recap of past 24 hours: Shannon Sellers is a 72 y.o. female with history of CAD status post CABG in November 2018 at [place] who is visiting [place] and has been in [place] for last 2 weeks.  Over the last 2 weeks patient has been having flulike symptoms with cough and sinus pressure and sometimes headache.  Patient also has been progressively getting short of breath with some chest pressure off and on with palpitations.  Given the symptoms patient came to the ER. Patient is still coughing quite a  lot, cough is mostly nonproductive.  Heart rate is more stable she still on the Cardizem drip   Assessment/Plan: Active Problems:   Atrial fibrillation with RVR (HCC)   Asthma   CAD (coronary artery disease)   Headache   Dyspnea   Productive cough  We will transition her from Cardizem drip to p.o. Cardizem short acting and long-acting Cardizem in the morning Obstructive sleep apnea patient is on CPAP she uses CPAP at home  Code Status: Full  Family Communication: Discussed with patient about the findings and updated her on results and plan  Disposition Plan: Home   Consultants:  None  Procedures:  IV Cardizem drip  Antimicrobials:  Ceftriaxone  DVT prophylaxis: Apixaban   Objective: Vitals:   12/10/17 0454 12/10/17 0515 12/10/17 0816 12/10/17 1150  BP: 109/74  (!) 112/57 (!) 103/58  Pulse: 84     Resp: 15 (!) 32 17 17  Temp: 97.8 F (36.6 C)  98.4 F (36.9 C) 97.7 F (36.5 C)  TempSrc: Axillary  Oral Oral  SpO2: 98%  96% 98%  Weight:  83.7 kg (184 lb 9.6 oz)    Height:        Intake/Output Summary (Last 24 hours) at 12/10/2017 1600 Last data filed at 12/10/2017 0700 Gross per 24 hour  Intake 465 ml  Output 250 ml  Net 215 ml   Filed Weights   12/09/17 1556 12/09/17 1859 12/10/17 0515  Weight: 81.6 kg (180 lb) 84.4 kg (186 lb 1.1 oz) 83.7 kg  (184 lb 9.6 oz)   Body mass index is 33.76 kg/m.  Exam:     (1.575 m)    Eyes: Anicteric no pallor. ENMT: No discharge from the ears eyes nose or mouth. Neck: No mass palpated no JVD appreciated. Respiratory: No rhonchi or crepitations.  Paroxysmal coughing Cardiovascular: S1-S2 heard no murmurs appreciated. Abdomen: Soft nontender bowel sounds present.  Musculoskeletal: No edema no joint effusion. Skin: No rash. Neurologic: Awake oriented to time place and person.  Moves all extremities. Psychiatric: Appears normal.      Data Reviewed: CBC: Recent Labs  Lab 12/09/17 1453 12/10/17 0419  WBC 14.7* 11.7*  HGB 13.9 12.4  HCT 41.3 38.9  MCV 82.6 85.5  PLT 306 277   Basic Metabolic Panel: Recent Labs  Lab 12/09/17 1453 12/09/17 2240 12/10/17 0419  NA 137  --  140  K 3.7  --  4.3  CL 105  --  106  CO2 22  --  26  GLUCOSE 91  --  106*  BUN 11  --  9  CREATININE 0.85  --  1.02*  CALCIUM 9.6  --  9.2  MG  --  1.9  --    GFR: Estimated Creatinine Clearance: 48.5 mL/min (A) (by C-G formula based on SCr of 1.02 mg/dL (H)). Liver Function Tests: Recent Labs  Lab 12/09/17 1453 12/10/17 0419  AST 52* 49*  ALT 43 41  ALKPHOS 150* 134*  BILITOT 1.9* 1.7*  PROT 7.4 6.4*  ALBUMIN 4.1 3.3*   No results for input(s): LIPASE, AMYLASE in the last 168 hours. No results for input(s): AMMONIA in the last 168 hours. Coagulation Profile: Recent Labs  Lab 12/09/17 1453  INR 1.12   Cardiac Enzymes: Recent Labs  Lab 12/09/17 1453 12/09/17 2240 12/10/17 0419 12/10/17 0803  TROPONINI <0.03 <0.03 <0.03 <0.03   BNP (last 3 results) No results for input(s): PROBNP in the last 8760 hours. HbA1C: Recent Labs    12/09/17 2240  HGBA1C 6.0*   CBG: No results for input(s): GLUCAP in the last 168 hours. Lipid Profile: No results for input(s): CHOL, HDL, LDLCALC, TRIG, CHOLHDL, LDLDIRECT in the last 72 hours. Thyroid Function Tests: No results for  input(s): TSH, T4TOTAL, FREET4, T3FREE, THYROIDAB in the last 72 hours. Anemia Panel: No results for input(s): VITAMINB12, FOLATE, FERRITIN, TIBC, IRON, RETICCTPCT in the last 72 hours. Urine analysis:    Component Value Date/Time   COLORURINE YELLOW 12/10/2017 0218   APPEARANCEUR CLEAR 12/10/2017 0218   LABSPEC 1.018 12/10/2017 0218   PHURINE 6.0 12/10/2017 0218   GLUCOSEU NEGATIVE 12/10/2017 0218   HGBUR NEGATIVE 12/10/2017 0218   BILIRUBINUR NEGATIVE 12/10/2017 0218   KETONESUR NEGATIVE 12/10/2017 0218   PROTEINUR NEGATIVE 12/10/2017 0218   NITRITE NEGATIVE 12/10/2017 0218   LEUKOCYTESUR TRACE (A) 12/10/2017 0218   Sepsis Labs: (procalcitonin:4,lacticidven:4)  ) Recent Results (from the past 240 hour(s))  Culture, blood (routine x 2) Call MD if unable to obtain prior to antibiotics being given     Status: None (Preliminary result)   Collection Time: 12/09/17 10:35 PM  Result Value Ref Range Status   Specimen Description   Final    BLOOD RIGHT HAND Performed at [facility], [address] [place], [place] [address]    Special Requests   Final    BOTTLES DRAWN AEROBIC ONLY Blood Culture adequate volume   Culture PENDING  Incomplete   Report Status PENDING  Incomplete  Culture, blood (routine x 2) Call MD if unable to obtain prior to antibiotics being given     Status: None (Preliminary result)   Collection Time: 12/09/17 10:40 PM  Result Value Ref Range Status   Specimen Description   Final    BLOOD RIGHT HAND Performed at [facility], [address], [place], [place] [address]    Special Requests   Final    BOTTLES DRAWN AEROBIC ONLY Blood Culture adequate volume   Culture PENDING  Incomplete   Report Status PENDING  Incomplete      Studies: Ct Head Wo Contrast  Result Date: 12/09/2017 CLINICAL DATA:  72 y/o  F; worsening headache since yesterday. EXAM: CT HEAD WITHOUT CONTRAST TECHNIQUE: Contiguous axial images were obtained from the base  of the skull through the vertex without intravenous contrast. COMPARISON:  None. FINDINGS: Brain: No evidence of acute infarction, hemorrhage, hydrocephalus, extra-axial collection or mass lesion/mass effect. Small lacunar infarcts are present within the right caudate head and thalamus. Mild chronic microvascular ischemic changes and moderate parenchymal volume loss of the brain for age. Vascular: Extensive calcific atherosclerosis of carotid siphons. Skull: Normal. Negative for fracture or focal lesion. Sinuses/Orbits: Mild ethmoid and maxillary sinus mucosal thickening. Normal aeration of mastoid air cells. Bilateral intra-ocular lens replacement. Other: None. IMPRESSION: 1. No acute intracranial abnormality identified. 2. Small chronic lacunar infarcts of  the basal ganglia, mild chronic microvascular ischemic changes, and moderate parenchymal volume loss of the brain. Electronically Signed   By: Mitzi Hansen M.D.   On: 12/09/2017 22:11    Scheduled Meds: . apixaban  2.5 mg Oral BID  . aspirin EC  81 mg Oral Daily  . atorvastatin  80 mg Oral q1800  . chlorhexidine  15 mL Mouth Rinse BID  . docusate sodium  100 mg Oral BID  . famotidine  20 mg Oral Daily  . fluticasone furoate-vilanterol  1 puff Inhalation Daily  . furosemide  40 mg Oral BID  . loratadine  10 mg Oral Daily  . mouth rinse  15 mL Mouth Rinse q12n4p  . memantine  7 mg Oral Daily  . metoprolol tartrate  50 mg Oral BID  . montelukast  10 mg Oral Daily  . PARoxetine  10 mg Oral Daily    Continuous Infusions: . azithromycin    . cefTRIAXone (ROCEPHIN)  IV    . diltiazem (CARDIZEM) infusion 5 mg/hr (12/10/17 0106)     LOS: 1 day     Myrtie Neither, MD [facility]  To reach me or the doctor on call, go to: [email or web address] Password [email or web address]  12/10/2017, 4:00 PM

## 2017-12-10 NOTE — Progress Notes (Signed)
The patient is currently on her home CPAP

## 2017-12-10 NOTE — Care Management Note (Signed)
Case Management Note  Patient Details  Name: Semya Klinke MRN: [id number] Date of Birth: November 27, 1945  Subjective/Objective:                 Patient from [facility], admitted w Afib. Patient provided PCP name as Janann August and Duane Boston [phone] [address] [place] [address]. Patient lives at [facility] of [address] through [employer] [facility] [employer]. Patient is independent, no HH needs identified. Has been on Eliquis in the past and denies difficulties with copays.  No CM needs identified at this time.    Action/Plan:   Expected Discharge Date:                  Expected Discharge Plan:  Home/Self Care  In-House Referral:     Discharge planning Services  CM Consult  Post Acute Care Choice:    Choice offered to:     DME Arranged:    DME Agency:     HH Arranged:    HH Agency:     Status of Service:  In process, will continue to follow  If discussed at Long Length of Stay Meetings, dates discussed:    Additional Comments:  Lawerance Sabal, RN 12/10/2017, 3:57 PM

## 2017-12-11 ENCOUNTER — Inpatient Hospital Stay (HOSPITAL_COMMUNITY): Payer: Medicare Other

## 2017-12-11 DIAGNOSIS — I4891 Unspecified atrial fibrillation: Secondary | ICD-10-CM

## 2017-12-11 DIAGNOSIS — I340 Nonrheumatic mitral (valve) insufficiency: Secondary | ICD-10-CM

## 2017-12-11 LAB — CBC WITH DIFFERENTIAL/PLATELET
BASOS ABS: 0.1 10*3/uL (ref 0.0–0.1)
BASOS PCT: 0 %
EOS PCT: 5 %
Eosinophils Absolute: 0.7 10*3/uL (ref 0.0–0.7)
HEMATOCRIT: 38.3 % (ref 36.0–46.0)
Hemoglobin: 12.3 g/dL (ref 12.0–15.0)
Lymphocytes Relative: 8 %
Lymphs Abs: 1.2 10*3/uL (ref 0.7–4.0)
MCH: 27.3 pg (ref 26.0–34.0)
MCHC: 32.1 g/dL (ref 30.0–36.0)
MCV: 85.1 fL (ref 78.0–100.0)
MONO ABS: 1.7 10*3/uL — AB (ref 0.1–1.0)
Monocytes Relative: 12 %
NEUTROS ABS: 10.9 10*3/uL — AB (ref 1.7–7.7)
Neutrophils Relative %: 75 %
PLATELETS: 262 10*3/uL (ref 150–400)
RBC: 4.5 MIL/uL (ref 3.87–5.11)
RDW: 18.4 % — AB (ref 11.5–15.5)
WBC: 14.6 10*3/uL — ABNORMAL HIGH (ref 4.0–10.5)

## 2017-12-11 LAB — BASIC METABOLIC PANEL
ANION GAP: 11 (ref 5–15)
BUN: 9 mg/dL (ref 6–20)
CALCIUM: 9 mg/dL (ref 8.9–10.3)
CO2: 25 mmol/L (ref 22–32)
CREATININE: 0.94 mg/dL (ref 0.44–1.00)
Chloride: 99 mmol/L — ABNORMAL LOW (ref 101–111)
GFR, EST NON AFRICAN AMERICAN: 58 mL/min — AB (ref >60)
Glucose, Bld: 104 mg/dL — ABNORMAL HIGH (ref 65–99)
Potassium: 4 mmol/L (ref 3.5–5.1)
Sodium: 135 mmol/L (ref 135–145)

## 2017-12-11 LAB — ECHOCARDIOGRAM COMPLETE
HEIGHTINCHES: 62 in
WEIGHTICAEL: 2953.6 [oz_av]

## 2017-12-11 LAB — LEGIONELLA PNEUMOPHILA SEROGP 1 UR AG: L. PNEUMOPHILA SEROGP 1 UR AG: NEGATIVE

## 2017-12-11 MED ORDER — DILTIAZEM HCL 60 MG PO TABS
60.0000 mg | ORAL_TABLET | Freq: Three times a day (TID) | ORAL | Status: DC
  Administered 2017-12-11 – 2017-12-12 (×3): 60 mg via ORAL
  Filled 2017-12-11 (×3): qty 1

## 2017-12-11 MED ORDER — METHYLPREDNISOLONE SODIUM SUCC 40 MG IJ SOLR
40.0000 mg | Freq: Two times a day (BID) | INTRAMUSCULAR | Status: DC
  Administered 2017-12-11 – 2017-12-12 (×3): 40 mg via INTRAVENOUS
  Filled 2017-12-11 (×3): qty 1

## 2017-12-11 NOTE — Progress Notes (Signed)
Pt placed on home CPAP for the night with nasal mask.  Tolerating well.

## 2017-12-11 NOTE — Consult Note (Signed)
CARDIOLOGY CONSULT NOTE  Patient ID: Shannon Sellers MRN: [id number] DOB/AGE: [birth year] 74 y.o.  Admit date: 12/09/2017 Referring Physician  Blanchard Mane, MD Primary Physician:  Patient, No Pcp Per Reason for Consultation  A. Fib  HPI: Shannon Sellers  is a 72 y.o. female  With Patient with known coronary artery disease S/P CABG in number 2018 in [place] him a presently visiting [place] visiting her friends.  She has been having generalized weakness, fatigue and probably low grade temperature over the past 2 weeks.  Over the past 2 days she has been incessantly coughing and hence her friends made her go to the urgent care followed by which she was sent over to the hospital for admission the emergency room.  In the emergency room she was found to be atrial fibrillation with RVR which improved with IV diltiazem.  She was also found to be mildly febrile, hence admitted for possible pneumonia and also for atrial fibrillation rate control.  Was the past 2 weeks she has noticed gradual worsening dyspnea.  But denies any PND or orthopnea.  She also states that she has been coughing a lot especially last 2 days and has been bringing up sputum but denies hemoptysis or yellowish sputum.  She is also noticed occasional chest tightness.  Her past medical history significant for atrial fibrillation, which appears to be chronic.  She also has hypertension, hyperlipidemia, GERD, anxiety and depression.  I was consulted for atrial fibrillation management.  Presently patient states that she is feeling better but states that if she walks any distance she will start feeling more short of breath.  Denies any chest pain.  Past Medical History:  Diagnosis Date  . Asthma   . 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 Atrial fibrillation (HCC)   . Congestive heart failure (CHF) [facility])      Past Surgical History:  Procedure Laterality Date  . CORONARY ARTERY BYPASS GRAFT     6 vessels      Family History  Problem Relation Age of Onset  . CAD Mother   . Alzheimer's disease Brother      Social History: Social History   Socioeconomic History  . Marital status: Single    Spouse name: Not on file  . Number of children: Not on file  . Years of education: Not on file  . Highest education level: Not on file  Occupational History  . Not on file  Social Needs  . Financial resource strain: Not on file  . Food insecurity:    Worry: Not on file    Inability: Not on file  . Transportation needs:    Medical: Not on file    Non-medical: Not on file  Tobacco Use  . Smoking status: Former Games developer  . Smokeless tobacco: Never Used  Substance and Sexual Activity  . Alcohol use: Not Currently  . Drug use: Never  . Sexual activity: Not on file  Lifestyle  . Physical activity:    Days per week: Not on file    Minutes per session: Not on file  . Stress: Not on file  Relationships  . Social connections:    Talks on phone: Not on file    Gets together: Not on file    Attends religious service: Not on file    Active member of club or organization: Not on file    Attends meetings of clubs or organizations: Not on file    Relationship status: Not on file  .  Intimate partner violence:    Fear of current or ex partner: Not on file    Emotionally abused: Not on file    Physically abused: Not on file    Forced sexual activity: Not on file  Other Topics Concern  . Not on file  Social History Narrative  . Not on file     Medications Prior to Admission  Medication Sig Dispense Refill Last Dose  . apixaban (ELIQUIS) 2.5 MG TABS tablet Take by mouth 2 (two) times daily.    12/09/2017 at Unknown time  . aspirin EC 81 MG tablet Take 81 mg by mouth daily.   12/09/2017 at Unknown time  . atorvastatin (LIPITOR) 80 MG tablet ONE TABLET BY MOUTH IN THE EVENING  0 12/08/2017 at Unknown time  . BREO ELLIPTA 100-25 MCG/INH AEPB 1 puff daily.    12/09/2017 at Unknown time  . cetirizine (ZYRTEC)  10 MG tablet Take 10 mg by mouth daily.   12/09/2017 at Unknown time  . docusate sodium (COLACE) 100 MG capsule Take 100 mg by mouth 2 (two) times daily.   12/09/2017 at Unknown time  . famotidine (PEPCID) 20 MG tablet Take 20 mg by mouth daily.  0 12/09/2017 at Unknown time  . fexofenadine (ALLEGRA) 180 MG tablet Take 180 mg by mouth as needed for allergies or rhinitis.     . furosemide (LASIX) 40 MG tablet ONE TABLET BY MOUTH TWICE A DAY  0 12/09/2017 at Unknown time  . loperamide (IMODIUM) 2 MG capsule Take 2 mg by mouth as needed for diarrhea or loose stools.     . memantine (NAMENDA XR) 7 MG CP24 24 hr capsule ONE CAPSULE BY MOUTH DAILY FOR 7 DAYS THEN INCREASE TO TWO CAPSULES DAILY  2 12/09/2017 at Unknown time  . metoprolol tartrate (LOPRESSOR) 50 MG tablet ONE TABLET BY MOUTH TWICE A DAY  0 12/09/2017 at Unknown time  . montelukast (SINGULAIR) 10 MG tablet Take 10 mg by mouth daily.  0 12/08/2017 at Unknown time  . PARoxetine (PAXIL) 10 MG tablet Take 10 mg by mouth daily.  0 12/09/2017 at Unknown time  . albuterol (PROVENTIL) (2.5 MG/3ML) 0.083% nebulizer solution ONE UNIT DOSE VIA NEBULIZER EVERY SIX HOURS AS NEEDED  6     Review of Systems  Constitutional: Positive for fever and malaise/fatigue. Negative for chills, diaphoresis and weight loss.  HENT: Negative.   Eyes: Negative.   Respiratory: Positive for cough. Negative for hemoptysis.   Cardiovascular: Positive for chest pain. Negative for palpitations, orthopnea, claudication, leg swelling and PND.  Musculoskeletal: Negative for falls.  Skin: Negative.   Neurological: Positive for headaches.  Endo/Heme/Allergies: Does not bruise/bleed easily.  Psychiatric/Behavioral: Positive for depression (controlled).  All other systems reviewed and are negative.   Physical Exam: Blood pressure 134/74, pulse (!) 102, temperature (!) 97.2 F (36.2 C), temperature source Oral, resp. rate 17, height  (1.575 m), weight 83.7 kg (184 lb 9.6 oz), SpO2 92  %.   Physical Exam  Constitutional: She is oriented to person, place, and time. She appears well-nourished. No distress.  HENT:  Head: Atraumatic.  Eyes: Conjunctivae are normal.  Neck: Neck supple. No JVD present.  Cardiovascular: Intact distal pulses.  S1 is variable, S2 is normal.  There is no gallop or murmur.  Pulmonary/Chest: Effort normal.  Course scattered rhonchi present left worse than the right base.  Abdominal: Soft. Bowel sounds are normal.  Musculoskeletal: Normal range of motion. She exhibits no edema or  tenderness.  Neurological: She is alert and oriented to person, place, and time.  Skin: Skin is warm and dry. She is not diaphoretic.  Psychiatric: She has a normal mood and affect.   Labs:   Lab Results  Component Value Date   WBC 14.6 (H) 12/11/2017   HGB 12.3 12/11/2017   HCT 38.3 12/11/2017   MCV 85.1 12/11/2017   PLT 262 12/11/2017    Recent Labs  Lab 12/10/17 0419 12/11/17 0406  NA 140 135  K 4.3 4.0  CL 106 99*  CO2 26 25  BUN 9 9  CREATININE 1.02* 0.94  CALCIUM 9.2 9.0  PROT 6.4*  --   BILITOT 1.7*  --   ALKPHOS 134*  --   ALT 41  --   AST 49*  --   GLUCOSE 106* 104*    BNP (last 3 results) Recent Labs    12/09/17 1453  BNP 156.0*    HEMOGLOBIN A1C Lab Results  Component Value Date   HGBA1C 6.0 (H) 12/09/2017   MPG 125.5 12/09/2017    Cardiac Panel (last 3 results) Recent Labs    12/09/17 2240 12/10/17 0419 12/10/17 0803  TROPONINI <0.03 <0.03 <0.03    TSH No results for input(s): TSH in the last 8760 hours.  Radiology: Ct Head Wo Contrast  Result Date: 12/09/2017 CLINICAL DATA:  72 y/o  F; worsening headache since yesterday. EXAM: CT HEAD WITHOUT CONTRAST TECHNIQUE: Contiguous axial images were obtained from the base of the skull through the vertex without intravenous contrast. COMPARISON:  None. FINDINGS: Brain: No evidence of acute infarction, hemorrhage, hydrocephalus, extra-axial collection or mass lesion/mass  effect. Small lacunar infarcts are present within the right caudate head and thalamus. Mild chronic microvascular ischemic changes and moderate parenchymal volume loss of the brain for age. Vascular: Extensive calcific atherosclerosis of carotid siphons. Skull: Normal. Negative for fracture or focal lesion. Sinuses/Orbits: Mild ethmoid and maxillary sinus mucosal thickening. Normal aeration of mastoid air cells. Bilateral intra-ocular lens replacement. Other: None. IMPRESSION: 1. No acute intracranial abnormality identified. 2. 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 Electronically Signed   By: Mitzi Hansen M.D.   On: 12/09/2017 22:11   Dg Chest Portable 1 View  Result Date: 12/09/2017 CLINICAL DATA:  Productive cough EXAM: PORTABLE CHEST 1 VIEW COMPARISON:  None. FINDINGS: Mild cardiomegaly. Normal vascularity. Postop changes from CABG. No pneumothorax or pleural effusion. Low volumes. IMPRESSION: Cardiomegaly without decompensation. Electronically Signed   By: Jolaine Click M.D.   On: 12/09/2017 15:20    Scheduled Meds: . apixaban  5 mg Oral BID  . aspirin EC  81 mg Oral Daily  . atorvastatin  80 mg Oral q1800  . chlorhexidine  15 mL Mouth Rinse BID  . diltiazem  30 mg Oral Q8H  . docusate sodium  100 mg Oral BID  . famotidine  20 mg Oral Daily  . fluticasone furoate-vilanterol  1 puff Inhalation Daily  . furosemide  40 mg Oral BID  . loratadine  10 mg Oral Daily  . mouth rinse  15 mL Mouth Rinse q12n4p  . memantine  7 mg Oral Daily  . methylPREDNISolone (SOLU-MEDROL) injection  40 mg Intravenous Q12H  . metoprolol tartrate  50 mg Oral BID  . montelukast  10 mg Oral Daily  . PARoxetine  10 mg Oral Daily   Continuous Infusions: . azithromycin Stopped (12/10/17 1909)  . cefTRIAXone (ROCEPHIN)  IV Stopped (12/10/17 1806)   PRN Meds:.acetaminophen **OR** acetaminophen,  acetaminophen-codeine, guaiFENesin-dextromethorphan, levalbuterol, ondansetron **OR** ondansetron (ZOFRAN) IV  CARDIAC STUDIES:  EKG: 12/10/2017:atrial fibrillation with controlled ventricular response.  No evidence of ischemia.  ECHO:12/11/2017: Normal LV systolic function without wall motion abnormality,grade 1 diastolic dysfunction,moderate mitral annular calcification.  Mild MR, mild left atrial enlargement.  RV function mildly reduced and right atrium also mildly enlarged.  Moderate to severe tricuspid regurgitation. PA pressure 24 mmHg.  ASSESSMENT AND PLAN:  1.  Atrial fibrillation with rapid response, now rate is well controlled, IV diltiazem has been discontinued. CHA2DS2-VASCScore: Risk Score  5 2. Hypertension, well controlled 3.  Acute on chronic diastolic heart failure with mild elevation in BNP. 4.  Shortness of breath and dyspnea on exertion probably related to underlying bronchial asthma, mild obesity and mild congestive heart failure along with probable bronchitis. 5.  Abnormal liver enzymes 6.  Chronic kidney disease stage III probably secondary to hypertension 7.  Hyperglycemia  Recommendation: Heart rate has started to trend up.  I'll increase diltiazem 30 mg t.i.d. to 60 mg 3 times a day. I do not know whether she has paroxysmal atrial fibrillation or chronic atrial fibrillation although she has been told to have atrial fibrillation in the past.  The left atrium is only mildly enlarged and hence she may benefit from direct current cardioversion as she is on chronic anticoagulation.  She has abnormal liver enzymes, we'll order ultrasound of abdomen and recheck CMP in the morning.  She is presently on Lasix, do not think congestive heart failure is playing a major role, will discontinue this tomorrow. With regard to hyperlipidemia, we will hold off Lipitor for now until the trend of LFTs is known.  She is not diabetic with hyperglycemic.   Yates Decamp, MD 12/11/2017, 11:18  AM [facility] Pager: [phone] Office: [phone] If no answer Cell [phone]

## 2017-12-11 NOTE — Progress Notes (Addendum)
PROGRESS NOTE    Shannon Sellers  NWG:[id number] DOB: 1945-08-19 DOA: 12/09/2017 PCP: Patient, No Pcp Per  Brief Narrative:72 y.o. female with history of CAD status post CABG in November 2018 at [facility] who is visiting [place] and has been in [place] for last 2 weeks.  Over the last 2 weeks patient has been having flulike symptoms with cough and sinus pressure and sometimes headache.  Patient also has been progressively getting short of breath with some chest pressure off and on with palpitations.  Given the symptoms patient came to the ER.  ED Course: In the ER patient is found to be in A. fib with RVR.  Patient was started on Cardizem infusion.  Patient was mildly febrile with leukocytosis chest x-ray was unremarkable.  Given her respiratory tract symptoms patient was empirically started on antibiotics for possible pneumonia.  Patient admitted for further management.  Review of Systems: As per HPI, rest all negative.  12/11/2017 patient continues to have this dry cough and wheezing.  Assessment & Plan:   Active Problems:   Atrial fibrillation with RVR (HCC)   Asthma   CAD (coronary artery disease)   Headache   Dyspnea   Productive cough  1. A. fib with RVR -patient had postoperative A. fib after her CABG in November 2018 following which patient was briefly on anticoagulation was discontinued.  Patient presently is in A. fib with RVR given her chads 2 vasc score of at least 2 patient started on apixaban.  We will closely observe and stepdown.  D-dimer normal, echocardiogram chest done pending.  Enzymes negative.  On metoprolol tartrate and Cardizem.  Patient started on apixaban. 2]Fever and cough concerning for pneumonia patient is placed empiric antibiotic.  Urine strep pneumo negative. 2. CAD status post CABG denies any chest pain at this time did have some chest pain off and on last 2 weeks.  Patient is on aspirin Plavix statins and metoprolol.  Added Xopenex. 3. Possible CHF  patient is on Lasix.  Check 2D echo. 4. Headache -patient states he has been having worsening headache last 2 to 3 days.  Will check CT head and sed rate. 5. History of asthma-bilateral wheezing noted.  We will start her on Solu-Medrol 40 every 12.  Continue Xopenex nebulizer treatments.  Along with codeine cough syrup. 6. Sleep apnea on CPAP. 7. 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      DVT prophylaxis apixaban Code Status full code Family Communication: No family available Disposition Plan TBD  Consultants: None   None Procedures: Antimicrobials: Azithromycin and Rocephin   Subjective:    Objective: Vitals:   12/11/17 0006 12/11/17 0636 12/11/17 0833 12/11/17 0855  BP: 113/71 125/70 113/89   Pulse: 77     Resp: 17     Temp: 98.8 F (37.1 C)  (!) 97.2 F (36.2 C)   TempSrc: Oral  Oral   SpO2:    92%  Weight:      Height:        Intake/Output Summary (Last 24 hours) at 12/11/2017 0920 Last data filed at 12/10/2017 2200 Gross per 24 hour  Intake 240 ml  Output -  Net 240 ml   Filed Weights   12/09/17 1556 12/09/17 1859 12/10/17 0515  Weight: 81.6 kg (180 lb) 84.4 kg (186 lb 1.1 oz) 83.7 kg (184 lb 9.6 oz)    Examination:  General exam: Appears calm and comfortable  Respiratory system: Clear to auscultation. Respiratory effort normal. Cardiovascular system: S1 & S2 heard, RRR.  No JVD, murmurs, rubs, gallops or clicks. No pedal edema. Gastrointestinal system: Abdomen is nondistended, soft and nontender. No organomegaly or masses felt. Normal bowel sounds heard. Central nervous system: Alert and oriented. No focal neurological deficits. Extremities: Symmetric 5 x 5 power. Skin: No rashes, lesions or ulcers Psychiatry: Judgement and insight appear normal. Mood & affect appropriate.     Data Reviewed: I have personally reviewed following labs and imaging studies  CBC: Recent Labs  Lab 12/09/17 1453 12/10/17 0419 12/11/17 0406  WBC 14.7* 11.7* 14.6*    NEUTROABS  --   --  10.9*  HGB 13.9 12.4 12.3  HCT 41.3 38.9 38.3  MCV 82.6 85.5 85.1  PLT 306 277 262   Basic Metabolic Panel: Recent Labs  Lab 12/09/17 1453 12/09/17 2240 12/10/17 0419 12/11/17 0406  NA 137  --  140 135  K 3.7  --  4.3 4.0  CL 105  --  106 99*  CO2 22  --  26 25  GLUCOSE 91  --  106* 104*  BUN 11  --  9 9  CREATININE 0.85  --  1.02* 0.94  CALCIUM 9.6  --  9.2 9.0  MG  --  1.9  --   --    GFR: Estimated Creatinine Clearance: 52.6 mL/min (by C-G formula based on SCr of 0.94 mg/dL). Liver Function Tests: Recent Labs  Lab 12/09/17 1453 12/10/17 0419  AST 52* 49*  ALT 43 41  ALKPHOS 150* 134*  BILITOT 1.9* 1.7*  PROT 7.4 6.4*  ALBUMIN 4.1 3.3*   No results for input(s): LIPASE, AMYLASE in the last 168 hours. No results for input(s): AMMONIA in the last 168 hours. Coagulation Profile: Recent Labs  Lab 12/09/17 1453  INR 1.12   Cardiac Enzymes: Recent Labs  Lab 12/09/17 1453 12/09/17 2240 12/10/17 0419 12/10/17 0803  TROPONINI <0.03 <0.03 <0.03 <0.03   BNP (last 3 results) No results for input(s): PROBNP in the last 8760 hours. HbA1C: Recent Labs    12/09/17 2240  HGBA1C 6.0*   CBG: No results for input(s): GLUCAP in the last 168 hours. Lipid Profile: No results for input(s): CHOL, HDL, LDLCALC, TRIG, CHOLHDL, LDLDIRECT in the last 72 hours. Thyroid Function Tests: No results for input(s): TSH, T4TOTAL, FREET4, T3FREE, THYROIDAB in the last 72 hours. Anemia Panel: No results for input(s): VITAMINB12, FOLATE, FERRITIN, TIBC, IRON, RETICCTPCT in the last 72 hours. Sepsis Labs: No results for input(s): PROCALCITON, LATICACIDVEN in the last 168 hours.  Recent Results (from the past 240 hour(s))  Culture, blood (routine x 2) Call MD if unable to obtain prior to antibiotics being given     Status: None (Preliminary result)   Collection Time: 12/09/17 10:35 PM  Result Value Ref Range Status   Specimen Description   Final    BLOOD  RIGHT HAND Performed at [facility], [address], [place], [place] [address]    Special Requests   Final    BOTTLES DRAWN AEROBIC ONLY Blood Culture adequate volume   Culture PENDING  Incomplete   Report Status PENDING  Incomplete  Culture, blood (routine x 2) Call MD if unable to obtain prior to antibiotics being given     Status: None (Preliminary result)   Collection Time: 12/09/17 10:40 PM  Result Value Ref Range Status   Specimen Description   Final    BLOOD RIGHT HAND Performed at [facility], [address], [place], [place] [address]    Special  Requests   Final    BOTTLES DRAWN AEROBIC ONLY Blood Culture adequate volume   Culture PENDING  Incomplete   Report Status PENDING  Incomplete         Radiology Studies: Ct Head Wo Contrast  Result Date: 12/09/2017 CLINICAL DATA:  72 y/o  F; worsening headache since yesterday. EXAM: CT HEAD WITHOUT CONTRAST TECHNIQUE: Contiguous axial images were obtained from the base of the skull through the vertex without intravenous contrast. COMPARISON:  None. FINDINGS: Brain: No evidence of acute infarction, hemorrhage, hydrocephalus, extra-axial collection or mass lesion/mass effect. Small lacunar infarcts are present within the right caudate head and thalamus. Mild chronic microvascular ischemic changes and moderate parenchymal volume loss of the brain for age. Vascular: Extensive calcific atherosclerosis of carotid siphons. Skull: Normal. Negative for fracture or focal lesion. Sinuses/Orbits: Mild ethmoid and maxillary sinus mucosal thickening. Normal aeration of mastoid air cells. Bilateral intra-ocular lens replacement. Other: None. IMPRESSION: 1. No acute intracranial abnormality identified. 2. 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 Electronically Signed   By: Mitzi Hansen M.D.   On: 12/09/2017 22:11   Dg  Chest Portable 1 View  Result Date: 12/09/2017 CLINICAL DATA:  Productive cough EXAM: PORTABLE CHEST 1 VIEW COMPARISON:  None. FINDINGS: Mild cardiomegaly. Normal vascularity. Postop changes from CABG. No pneumothorax or pleural effusion. Low volumes. IMPRESSION: Cardiomegaly without decompensation. Electronically Signed   By: Jolaine Click M.D.   On: 12/09/2017 15:20        Scheduled Meds: . apixaban  5 mg Oral BID  . aspirin EC  81 mg Oral Daily  . atorvastatin  80 mg Oral q1800  . chlorhexidine  15 mL Mouth Rinse BID  . diltiazem  30 mg Oral Q8H  . docusate sodium  100 mg Oral BID  . famotidine  20 mg Oral Daily  . fluticasone furoate-vilanterol  1 puff Inhalation Daily  . furosemide  40 mg Oral BID  . loratadine  10 mg Oral Daily  . mouth rinse  15 mL Mouth Rinse q12n4p  . memantine  7 mg Oral Daily  . metoprolol tartrate  50 mg Oral BID  . montelukast  10 mg Oral Daily  . PARoxetine  10 mg Oral Daily   Continuous Infusions: . azithromycin Stopped (12/10/17 1909)  . cefTRIAXone (ROCEPHIN)  IV Stopped (12/10/17 1806)     LOS: 2 days     Alwyn Ren, MD [facility]  If 7PM-7AM, please contact night-coverage [email or web address] Password [email or web address] 12/11/2017, 9:20 AM

## 2017-12-11 NOTE — Progress Notes (Signed)
Echocardiogram 2D Echocardiogram has been performed.  Shannon Sellers 12/11/2017, 8:42 AM

## 2017-12-12 ENCOUNTER — Inpatient Hospital Stay (HOSPITAL_COMMUNITY): Payer: Medicare Other

## 2017-12-12 DIAGNOSIS — R748 Abnormal levels of other serum enzymes: Secondary | ICD-10-CM

## 2017-12-12 LAB — COMPREHENSIVE METABOLIC PANEL
ALT: 89 U/L — ABNORMAL HIGH (ref 14–54)
AST: 94 U/L — ABNORMAL HIGH (ref 15–41)
Albumin: 3.4 g/dL — ABNORMAL LOW (ref 3.5–5.0)
Alkaline Phosphatase: 183 U/L — ABNORMAL HIGH (ref 38–126)
Anion gap: 11 (ref 5–15)
BUN: 14 mg/dL (ref 6–20)
CHLORIDE: 102 mmol/L (ref 101–111)
CO2: 23 mmol/L (ref 22–32)
Calcium: 9.3 mg/dL (ref 8.9–10.3)
Creatinine, Ser: 0.95 mg/dL (ref 0.44–1.00)
GFR, EST NON AFRICAN AMERICAN: 58 mL/min — AB (ref >60)
Glucose, Bld: 160 mg/dL — ABNORMAL HIGH (ref 65–99)
POTASSIUM: 3.9 mmol/L (ref 3.5–5.1)
Sodium: 136 mmol/L (ref 135–145)
TOTAL PROTEIN: 6.9 g/dL (ref 6.5–8.1)
Total Bilirubin: 1.2 mg/dL (ref 0.3–1.2)

## 2017-12-12 LAB — CBC
HEMATOCRIT: 39.4 % (ref 36.0–46.0)
Hemoglobin: 12.7 g/dL (ref 12.0–15.0)
MCH: 26.8 pg (ref 26.0–34.0)
MCHC: 32.2 g/dL (ref 30.0–36.0)
MCV: 83.3 fL (ref 78.0–100.0)
PLATELETS: 278 10*3/uL (ref 150–400)
RBC: 4.73 MIL/uL (ref 3.87–5.11)
RDW: 17.3 % — ABNORMAL HIGH (ref 11.5–15.5)
WBC: 12.7 10*3/uL — AB (ref 4.0–10.5)

## 2017-12-12 MED ORDER — DILTIAZEM HCL ER COATED BEADS 240 MG PO CP24
240.0000 mg | ORAL_CAPSULE | Freq: Every day | ORAL | Status: DC
  Administered 2017-12-12 – 2017-12-13 (×2): 240 mg via ORAL
  Filled 2017-12-12 (×2): qty 1

## 2017-12-12 MED ORDER — AZITHROMYCIN 500 MG PO TABS
500.0000 mg | ORAL_TABLET | Freq: Every day | ORAL | Status: DC
Start: 2017-12-12 — End: 2017-12-13
  Administered 2017-12-12: 500 mg via ORAL
  Filled 2017-12-12 (×2): qty 1

## 2017-12-12 MED ORDER — LEVALBUTEROL HCL 0.63 MG/3ML IN NEBU
0.6300 mg | INHALATION_SOLUTION | Freq: Three times a day (TID) | RESPIRATORY_TRACT | Status: DC
  Administered 2017-12-12 – 2017-12-13 (×4): 0.63 mg via RESPIRATORY_TRACT
  Filled 2017-12-12 (×5): qty 3

## 2017-12-12 MED ORDER — METHYLPREDNISOLONE SODIUM SUCC 40 MG IJ SOLR
40.0000 mg | Freq: Every day | INTRAMUSCULAR | Status: DC
  Administered 2017-12-13: 40 mg via INTRAVENOUS
  Filled 2017-12-12: qty 1

## 2017-12-12 MED ORDER — CHLORTHALIDONE 25 MG PO TABS
25.0000 mg | ORAL_TABLET | Freq: Every day | ORAL | Status: DC
  Administered 2017-12-12 – 2017-12-13 (×2): 25 mg via ORAL
  Filled 2017-12-12 (×3): qty 1

## 2017-12-12 NOTE — Progress Notes (Signed)
PROGRESS NOTE    Shannon Sellers  ZOX:[id number] DOB: 1946/07/21 DOA: 12/09/2017 PCP: Patient, No Pcp Per  Brief Narrative:72 y.o.femalewithhistory of CAD status post CABG in November 2018 at [place] who is visiting [place] and has been in [place] for last 2 weeks. Over the last 2 weeks patient has been having flulike symptoms with cough and sinus pressure and sometimes headache. Patient also has been progressively getting short of breath with some chest pressure off and on with palpitations. Given the symptoms patient came to the ER.  ED Course:In the ER patient is found to be in A. fib with RVR. Patient was started on Cardizem infusion. Patient was mildly febrile with leukocytosis chest x-ray was unremarkable. Given her respiratory tract symptoms patient was empirically started on antibiotics for possible pneumonia. Patient admitted for further management.  Review of Systems: As per HPI, rest all negative.  12/11/2017 patient continues to have this dry cough and wheezing. 12/12/2017 patient reports that she has been coughing like this for the last 30 years.  Patient has a history of asthma.  She denies he denies any itching.    Assessment & Plan:   Active Problems:   Atrial fibrillation with RVR (HCC)   Asthma   CAD (coronary artery disease)   Headache   Dyspnea   Productive cough   Atrial fibrillation with tachycardic ventricular rate (HCC) 1] A. fib RVR has been resolved patient currently on Cardizem p.o. 60 mg 3 times a day.  Patient gives a history of postop A. fib after CABG in 2018 November.  Continue metoprolol and Eliquis.     ECHO -Left ventricle: The cavity size was normal. Systolic function was   normal. The estimated ejection fraction was in the range of 60%   to 65%. Mild focal basal septal hypertrophy. Wall motion was   normal; there were no regional wall motion abnormalities. The   study is not technically sufficient to allow evaluation of LV  diastolic function. Doppler parameters are consistent with high   ventricular filling pressure. - Aortic valve: Trileaflet; mildly thickened, mildly calcified   leaflets. Morphologically, there is likely a mild degree of   calcific aortic stenosis. - Mitral valve: Mildly to moderately calcified annulus. There was   mild regurgitation. - Left atrium: The atrium was mildly dilated. - Right ventricle: Systolic function was mildly reduced. - Right atrium: The atrium was mildly dilated. - Atrial septum: No defect or patent foramen ovale was identified. - Tricuspid valve: There was moderate-severe regurgitation.  2] chronic cough with asthma moderate persistent/bronchitis-continue Breo Ellipta, Singulair, taper steroids, and Claritin.  Chest x-ray shows no evidence of consolidation.  Placement Taper antibiotics down to Zithromax to finish the course.  DC Rocephin.  3] elevated LFTs  -trending up.  Follow-up  right upper quadrant ultrasound which is already done today.  Continue to hold Lipitor.  4] hypokalemia resolved.  5] history of CAD CABG in 2000 18 November    DVT prophylaxis: Eliquis Code Status: Full code Family Communication: No family available Disposition Plan: TBD Consultants: Cardiology  Procedures: None Antimicrobials: Azithromycin  Subjective: She is actually feeling better than yesterday.  Continues to have this chronic cough.   Objective: Vitals:   12/12/17 0128 12/12/17 0316 12/12/17 0326 12/12/17 0734  BP: 127/88  138/82 122/72  Pulse:   61   Resp:   16 15  Temp:   (!) 97.4 F (36.3 C) (!) 97.4 F (36.3 C)  TempSrc:   Oral Oral  SpO2:  94%   Weight:  83.6 kg (184 lb 6.4 oz)    Height:        Intake/Output Summary (Last 24 hours) at 12/12/2017 0949 Last data filed at 12/12/2017 0645 Gross per 24 hour  Intake 600 ml  Output 1550 ml  Net -950 ml   Filed Weights   12/09/17 1859 12/10/17 0515 12/12/17 0316  Weight: 84.4 kg (186 lb 1.1 oz) 83.7 kg (184  lb 9.6 oz) 83.6 kg (184 lb 6.4 oz)    Examination:  General exam: Appears calm and comfortable  Respiratory system: Clear to auscultation. Respiratory effort normal. Cardiovascular system: S1 & S2 heard, RRR. No JVD, murmurs, rubs, gallops or clicks. No pedal edema. Gastrointestinal system: Abdomen is nondistended, soft and nontender. No organomegaly or masses felt. Normal bowel sounds heard. Central nervous system: Alert and oriented. No focal neurological deficits. Extremities: Symmetric 5 x 5 power. Skin: No rashes, lesions or ulcers Psychiatry: Judgement and insight appear normal. Mood & affect appropriate.     Data Reviewed: I have personally reviewed following labs and imaging studies  CBC: Recent Labs  Lab 12/09/17 1453 12/10/17 0419 12/11/17 0406 12/12/17 0313  WBC 14.7* 11.7* 14.6* 12.7*  NEUTROABS  --   --  10.9*  --   HGB 13.9 12.4 12.3 12.7  HCT 41.3 38.9 38.3 39.4  MCV 82.6 85.5 85.1 83.3  PLT 306 277 262 278   Basic Metabolic Panel: Recent Labs  Lab 12/09/17 1453 12/09/17 2240 12/10/17 0419 12/11/17 0406 12/12/17 0313  NA 137  --  140 135 136  K 3.7  --  4.3 4.0 3.9  CL 105  --  106 99* 102  CO2 22  --  GLUCOSE 91  --  106* 104* 160*  BUN 11  --  CREATININE 0.85  --  1.02* 0.94 0.95  CALCIUM 9.6  --  9.2 9.0 9.3  MG  --  1.9  --   --   --    GFR: Estimated Creatinine Clearance: 52.1 mL/min (by C-G formula based on SCr of 0.95 mg/dL). Liver Function Tests: Recent Labs  Lab 12/09/17 1453 12/10/17 0419 12/12/17 0313  AST 52* 49* 94*  ALT 43 41 89*  ALKPHOS 150* 134* 183*  BILITOT 1.9* 1.7* 1.2  PROT 7.4 6.4* 6.9  ALBUMIN 4.1 3.3* 3.4*   No results for input(s): LIPASE, AMYLASE in the last 168 hours. No results for input(s): AMMONIA in the last 168 hours. Coagulation Profile: Recent Labs  Lab 12/09/17 1453  INR 1.12   Cardiac Enzymes: Recent Labs  Lab 12/09/17 1453 12/09/17 2240 12/10/17 0419 12/10/17 0803    TROPONINI <0.03 <0.03 <0.03 <0.03   BNP (last 3 results) No results for input(s): PROBNP in the last 8760 hours. HbA1C: Recent Labs    12/09/17 2240  HGBA1C 6.0*   CBG: No results for input(s): GLUCAP in the last 168 hours. Lipid Profile: No results for input(s): CHOL, HDL, LDLCALC, TRIG, CHOLHDL, LDLDIRECT in the last 72 hours. Thyroid Function Tests: No results for input(s): TSH, T4TOTAL, FREET4, T3FREE, THYROIDAB in the last 72 hours. Anemia Panel: No results for input(s): VITAMINB12, FOLATE, FERRITIN, TIBC, IRON, RETICCTPCT in the last 72 hours. Sepsis Labs: No results for input(s): PROCALCITON, LATICACIDVEN in the last 168 hours.  Recent Results (from the past 240 hour(s))  Culture, blood (routine x 2) Call MD if unable to obtain prior to antibiotics being given     Status:  None (Preliminary result)   Collection Time: 12/09/17 10:35 PM  Result Value Ref Range Status   Specimen Description BLOOD RIGHT HAND  Final   Special Requests   Final    BOTTLES DRAWN AEROBIC ONLY Blood Culture adequate volume   Culture   Final    NO GROWTH 1 DAY Performed at [facility], [address], [place], [place] [address]    Report Status PENDING  Incomplete  Culture, blood (routine x 2) Call MD if unable to obtain prior to antibiotics being given     Status: None (Preliminary result)   Collection Time: 12/09/17 10:40 PM  Result Value Ref Range Status   Specimen Description BLOOD RIGHT HAND  Final   Special Requests   Final    BOTTLES DRAWN AEROBIC ONLY Blood Culture adequate volume   Culture   Final    NO GROWTH 1 DAY Performed at [facility], [address] [place], [place] [address]    Report Status PENDING  Incomplete         Radiology Studies: No results found.      Scheduled Meds: . apixaban  5 mg Oral BID  . aspirin EC  81 mg Oral Daily  . chlorhexidine  15 mL Mouth Rinse BID  . diltiazem  60 mg Oral Q8H  . docusate sodium  100 mg Oral BID  .  famotidine  20 mg Oral Daily  . fluticasone furoate-vilanterol  1 puff Inhalation Daily  . loratadine  10 mg Oral Daily  . mouth rinse  15 mL Mouth Rinse q12n4p  . memantine  7 mg Oral Daily  . methylPREDNISolone (SOLU-MEDROL) injection  40 mg Intravenous Q12H  . metoprolol tartrate  50 mg Oral BID  . montelukast  10 mg Oral Daily  . PARoxetine  10 mg Oral Daily   Continuous Infusions: . azithromycin Stopped (12/11/17 1800)  . cefTRIAXone (ROCEPHIN)  IV Stopped (12/11/17 1630)     LOS: 3 days     Alwyn Ren, MD [facility] If 7PM-7AM, please contact night-coverage [email or web address] Password [facility] 12/12/2017, 9:49 AM

## 2017-12-12 NOTE — Progress Notes (Signed)
Subjective:  She is feeling better.  Dyspnea has improved.  Objective:  Vital Signs in the last 24 hours: Temp:  [97.4 F (36.3 C)-98.6 F (37 C)] 97.6 F (36.4 C) (05/05 0953) Pulse Rate:  [61-81] 81 (05/05 0956) Resp:  [15-25] 15 (05/05 0953) BP: (99-138)/(31-88) 120/77 (05/05 0956) SpO2:  [93 %-97 %] 96 % (05/05 0955) Weight:  [83.6 kg (184 lb 6.4 oz)] 83.6 kg (184 lb 6.4 oz) (05/05 0316)  Intake/Output from previous day: 05/04 0701 - 05/05 0700 In: 600 [IV Piggyback:600] Out: 1550 [Urine:1550] Intake/Output from this shift: No intake/output data recorded.  Physical Exam:  General appearance: alert, cooperative, appears stated age and no distress Lungs: wheezes bibasilar Heart: S1 is variable, S2 is normal.  No gallop or murmur appreciated. Abdomen: Obese.  Mild diffuse tenderness present.  No guarding.  No organomegaly. Extremities: extremities normal, atraumatic, no cyanosis or edema Pulses: 2+ and symmetric Neurologic: Grossly normal  Lab Results: Recent Labs    12/11/17 0406 12/12/17 0313  WBC 14.6* 12.7*  HGB 12.3 12.7  PLT 262 278   Recent Labs    12/11/17 0406 12/12/17 0313  NA 135 136  K 4.0 3.9  CL 99* 102  CO2 25 23  GLUCOSE 104* 160*  BUN 9 14  CREATININE 0.94 0.95   Recent Labs    12/10/17 0419 12/10/17 0803  TROPONINI <0.03 <0.03   Hepatic Function Panel Recent Labs    12/12/17 0313  PROT 6.9  ALBUMIN 3.4*  AST 94*  ALT 89*  ALKPHOS 183*  BILITOT 1.2   No results for input(s): CHOL in the last 72 hours. No results for input(s): PROTIME in the last 72 hours.  Imaging: US Abdomen Limited Ruq  Result Date: 12/12/2017 CLINICAL DATA:  72 year old female with history of abnormal liver enzymes. EXAM: ULTRASOUND ABDOMEN LIMITED RIGHT UPPER QUADRANT COMPARISON:  No priors. FINDINGS: Gallbladder: Multiple small echogenic foci, many of which demonstrate posterior acoustic shadowing, compatible with small gallstones, measuring up to 5 mm.  Gallbladder is not distended. Gallbladder wall thickness is normal at 3 mm. No pericholecystic fluid. Per report from the sonographer, there is no evidence of sonographic Murphy's sign on examination. Common bile duct: Diameter: 3 mm. Liver: No focal lesion identified. Diffusely increased hepatic echogenicity, suggestive of hepatic steatosis. Portal vein is patent on color Doppler imaging with normal direction of blood flow towards the liver. IMPRESSION: 1. Cholelithiasis without evidence of acute cholecystitis at this time. 2. Hepatic steatosis. Electronically Signed   By: Trudie Reed M.D.   On: 12/12/2017 10:26    Cardiac Studies: CARDIAC STUDIES:  EKG: 12/10/2017:atrial fibrillation with controlled ventricular response.  No evidence of ischemia.  ECHO:12/11/2017: Normal LV systolic function without wall motion abnormality,grade 1 diastolic dysfunction,moderate mitral annular calcification.  Mild MR, mild left atrial enlargement.  RV function mildly reduced and right atrium also mildly enlarged.  Moderate to severe tricuspid regurgitation. PA pressure 24 mmHg.  ASSESSMENT AND PLAN:  1.  Atrial fibrillation with controlled ventricular response. CHA2DS2-VASCScore: Risk Score  5 2. Hypertension, well controlled 3.  Acute on chronic diastolic heart failure with mild elevation in BNP. 4.  Shortness of breath and dyspnea on exertion probably related to underlying bronchial asthma, mild obesity and mild congestive heart failure along with probable bronchitis. 5.  Abnormal liver enzymes, abnormal ultrasound of the abdomen revealing gallbladder stones.  This is to be addressed by the primary team.  Continue to hold Lipitor for now. 6.  Chronic kidney disease  stage III probably secondary to hypertension 7.  Hyperglycemia, prediabetes.  Recommendation: Discontinue diltiazem 60 mg 3 times daily and switch to diltiazem CD 240 mg p.o. daily.  Continue metoprolol for atrial fibrillation rate control.   Continue anticoagulation.  Suspect she probably has chronic atrial fibrillation although her left atrial size is small, she can follow-up with her physician in [place] for consideration for cardioversion if deemed appropriate.  Change furosemide  from daily maintenance dose to as needed use, patient is aware of asthma and also congestive heart failure symptoms and how to differentiate between them.  Will add chlorthalidone 25 mg daily.  Weight loss discussed.  I will see her back on a as needed basis.  Please give all medical records to the patient to take home upon discharge.    LOS: 3 days    Yates Decamp, MD 12/12/2017, 11:26 AM [facility] [place] Pager: [phone] Office: [phone] If no answer: Cell:  [phone]

## 2017-12-12 NOTE — Progress Notes (Signed)
Into assess patient. Patient NPO at this time for [place]. They will be up shortly to get her.  K. Doran Clay, RN

## 2017-12-12 NOTE — Progress Notes (Signed)
PHARMACIST - PHYSICIAN COMMUNICATION  CONCERNING: Antibiotic IV to Oral Route Change Policy  RECOMMENDATION: This patient is receiving Azithromycin by the intravenous route.  Based on criteria approved by the Pharmacy and Therapeutics Committee, the antibiotic(s) is/are being converted to the equivalent oral dose form(s).   DESCRIPTION: These criteria include:  Patient being treated for a respiratory tract infection, urinary tract infection, cellulitis or clostridium difficile associated diarrhea if on metronidazole  The patient is not neutropenic and does not exhibit a GI malabsorption state  The patient is eating (either orally or via tube) and/or has been taking other orally administered medications for a least 24 hours  The patient is improving clinically and has a Tmax < 100.5  If you have questions about this conversion, please contact the Pharmacy Department    [phone] )  Jeani Hawking   [phone] )  [facility]   [phone] )  Redge Gainer   [phone] )  [facility]   [phone] )  Ilene Qua   Harland German, PharmD Clinical Pharmacist Clinical phone from 8:30-4:00 is [phone] After 4pm, please call Main Rx [phone]) for assistance. 12/12/2017 10:27 AM

## 2017-12-12 NOTE — Progress Notes (Signed)
Called and spoke with Caryn Bee in pharmacy for 2nd request of [place]. First request was through the [facility].

## 2017-12-12 NOTE — Progress Notes (Signed)
Pt placed on home CPAP for the night with nasal mask from home.  Tolerating well.

## 2017-12-13 LAB — COMPREHENSIVE METABOLIC PANEL
ALT: 122 U/L — AB (ref 14–54)
AST: 115 U/L — ABNORMAL HIGH (ref 15–41)
Albumin: 3.5 g/dL (ref 3.5–5.0)
Alkaline Phosphatase: 166 U/L — ABNORMAL HIGH (ref 38–126)
Anion gap: 10 (ref 5–15)
BUN: 20 mg/dL (ref 6–20)
CALCIUM: 9.6 mg/dL (ref 8.9–10.3)
CHLORIDE: 103 mmol/L (ref 101–111)
CO2: 24 mmol/L (ref 22–32)
CREATININE: 1.07 mg/dL — AB (ref 0.44–1.00)
GFR, EST AFRICAN AMERICAN: 58 mL/min — AB (ref >60)
GFR, EST NON AFRICAN AMERICAN: 50 mL/min — AB (ref >60)
Glucose, Bld: 149 mg/dL — ABNORMAL HIGH (ref 65–99)
Potassium: 3.6 mmol/L (ref 3.5–5.1)
Sodium: 137 mmol/L (ref 135–145)
Total Bilirubin: 0.7 mg/dL (ref 0.3–1.2)
Total Protein: 7 g/dL (ref 6.5–8.1)

## 2017-12-13 LAB — LIPID PANEL
CHOLESTEROL: 131 mg/dL (ref 0–200)
HDL: 48 mg/dL (ref >40)
LDL Cholesterol: 74 mg/dL (ref 0–99)
TRIGLYCERIDES: 46 mg/dL (ref <150)
Total CHOL/HDL Ratio: 2.7 RATIO
VLDL: 9 mg/dL (ref 0–40)

## 2017-12-13 MED ORDER — FUROSEMIDE 40 MG PO TABS: 40.0000 mg | ORAL_TABLET | Freq: Every day | ORAL | 0 refills | Status: AC | PRN

## 2017-12-13 MED ORDER — AZITHROMYCIN 500 MG PO TABS: 500.0000 mg | ORAL_TABLET | Freq: Every day | ORAL | 0 refills | Status: AC

## 2017-12-13 MED ORDER — CHLORTHALIDONE 25 MG PO TABS: 25.0000 mg | ORAL_TABLET | Freq: Every day | ORAL | 0 refills | Status: AC

## 2017-12-13 MED ORDER — DILTIAZEM HCL ER COATED BEADS 240 MG PO CP24: 240.0000 mg | ORAL_CAPSULE | Freq: Every day | ORAL | 0 refills | Status: AC

## 2017-12-13 MED ORDER — APIXABAN 5 MG PO TABS: 5.0000 mg | ORAL_TABLET | Freq: Two times a day (BID) | ORAL | 0 refills | Status: AC

## 2017-12-13 NOTE — Care Management Note (Signed)
Case Management Note Previous CM note completed by Lawerance Sabal, RN 12/10/2017, 3:57 PM  Patient Details  Name: Shannon Sellers MRN: [id number] Date of Birth: May 24, 1946  Subjective/Objective:                 Patient from [facility], admitted w Afib. Patient provided PCP name as Janann August and Duane Boston [phone] [address] [place] [address]. Patient lives at [facility] [address] through [employer] [facility] [employer]. Patient is independent, no HH needs identified. Has been on Eliquis in the past and denies difficulties with copays.  No CM needs identified at this time.    Action/Plan:   Expected Discharge Date:  12/13/17               Expected Discharge Plan:  Home/Self Care  In-House Referral:  NA  Discharge planning Services  CM Consult  Post Acute Care Choice:  NA Choice offered to:  NA  DME Arranged:    DME Agency:     HH Arranged:    HH Agency:     Status of Service:  Completed, signed off  If discussed at [employer] of Stay Meetings, dates discussed:    Discharge Disposition: home/self care   Additional Comments:  12/13/17- 1100- Shannon Sou RN, CM- pt for d/c home today- no CM needs noted for transition home- pt stay with friend here prior to returning to [place].  Zenda Alpers [place], RN 12/13/2017, 11:01 AM [phone] 4E Transition Care Coordinator

## 2017-12-13 NOTE — Care Management Important Message (Signed)
Important Message  Patient Details  Name: Shannon Sellers MRN: [id number] Date of Birth: 23-Aug-1945   Medicare Important Message Given:  Yes    Oralia Rud Maurizio Geno 12/13/2017, 2:42 PM

## 2017-12-13 NOTE — Progress Notes (Signed)
Order received to discharge patient.  Telemetry monitor removed and CCMD notified. PIV access removed.  Discharge instructions, follow up, medications and instructions for their use discussed with patient.  Patient signed release of information form so her records may be sent to her PCP in [place].

## 2017-12-13 NOTE — Discharge Summary (Addendum)
Physician Discharge Summary  Betsabe Iglesia MKL:[id number] DOB: 10-11-1945 DOA: 12/09/2017  PCP: Patient, No Pcp Per  Admit date: 12/09/2017 Discharge date: 12/13/2017  Admitted From home Disposition: Home Recommendations for Outpatient Follow-up:  1. Follow up with PCP in 1-2 weeks 2. Please obtain CMP/CBC 12/15/2017 3. Follow-up with cardiologist in 2 weeks home Health none Equipment/Devices: None Discharge Condition: Stable CODE STATUS full code Diet recommendation: Cardiac diet Brief/Interim Summary:72 y.o.femalewithhistory of CAD status post CABG in November 2018 at [place] who is visiting [place] and has been in [place] for last 2 weeks. Over the last 2 weeks patient has been having flulike symptoms with cough and sinus pressure and sometimes headache. Patient also has been progressively getting short of breath with some chest pressure off and on with palpitations. Given the symptoms patient came to the ER.  ED Course:In the ER patient is found to be in A. fib with RVR. Patient was started on Cardizem infusion. Patient was mildly febrile with leukocytosis chest x-ray was unremarkable. Given her respiratory tract symptoms patient was empirically started on antibiotics for possible pneumonia. Patient admitted for further management.     Discharge Diagnoses:  Active Problems:   Atrial fibrillation with RVR (HCC)   Asthma   CAD (coronary artery disease)   Headache   Dyspnea   Productive cough   Atrial fibrillation with tachycardic ventricular rate (HCC)   Abnormal liver enzymes  1] A. fib RVR has been resolved patient currently on Cardizem CD 240 mg daily.  Patient gives a history of postop A. fib after CABG in 2018 November.  Continue metoprolol and Eliquis.     ECHO -Left ventricle: The cavity size was normal. Systolic function was normal. The estimated ejection fraction was in the range of 60% to 65%. Mild focal basal septal hypertrophy. Wall motion  was normal; there were no regional wall motion abnormalities. The study is not technically sufficient to allow evaluation of LV diastolic function. Doppler parameters are consistent with high ventricular filling pressure. - Aortic valve: Trileaflet; mildly thickened, mildly calcified leaflets. Morphologically, there is likely a mild degree of calcific aortic stenosis. - Mitral valve: Mildly to moderately calcified annulus. There was mild regurgitation. - Left atrium: The atrium was mildly dilated. - Right ventricle: Systolic function was mildly reduced. - Right atrium: The atrium was mildly dilated. - Atrial septum: No defect or patent foramen ovale was identified. - Tricuspid valve: There was moderate-severe regurgitation.  2] chronic cough with asthma moderate persistent/bronchitis-continue Breo Ellipta, Singulair, taper steroids, and Claritin.  Chest x-ray shows no evidence of consolidation.  Placement Taper antibiotics down to Zithromax to finish the course.  DC Rocephin.  3] elevated LFTs  -her AST and ALT has increased to 115 and 122 from 94 and 89.  However alk phos is decreasing as well as total bilirubin is decreasing.  Gallbladder ultrasound shows gallstones with no evidence of cholecystitis.  I have discussed with the patient to sign a release form to send medical records to her primary care physician Dr. Danton Clap  in [place].  Continue to hold Lipitor.  I have tried to call Dr. Vic Blackbird office 12 times with no response.  Patient needs her liver function test checked in 3 days.  4] hypokalemia resolved.  5] history of CAD CABG in 2000 18 November     Discharge Instructions   Allergies as of 12/13/2017      Reactions   Asparagus    Nausea, hives   Eggs Or  Egg-derived Products    Shellfish Allergy Hives      Medication List    STOP taking these medications   atorvastatin 80 MG tablet Commonly known as:  LIPITOR   loperamide  2 MG capsule Commonly known as:  IMODIUM     TAKE these medications   albuterol (2.5 MG/3ML) 0.083% nebulizer solution Commonly known as:  PROVENTIL ONE UNIT DOSE VIA NEBULIZER EVERY SIX HOURS AS NEEDED   apixaban 5 MG Tabs tablet Commonly known as:  ELIQUIS Take 1 tablet (5 mg total) by mouth 2 (two) times daily. What changed:    medication strength  how much to take   aspirin EC 81 MG tablet Take 81 mg by mouth daily.   azithromycin 500 MG tablet Commonly known as:  ZITHROMAX Take 1 tablet (500 mg total) by mouth daily before supper.   BREO ELLIPTA 100-25 MCG/INH Aepb Generic drug:  fluticasone furoate-vilanterol 1 puff daily.   cetirizine 10 MG tablet Commonly known as:  ZYRTEC Take 10 mg by mouth daily.   chlorthalidone 25 MG tablet Commonly known as:  HYGROTON Take 1 tablet (25 mg total) by mouth daily. Start taking on:  12/14/2017   diltiazem 240 MG 24 hr capsule Commonly known as:  CARDIZEM CD Take 1 capsule (240 mg total) by mouth daily. Start taking on:  12/14/2017   docusate sodium 100 MG capsule Commonly known as:  COLACE Take 100 mg by mouth 2 (two) times daily.   famotidine 20 MG tablet Commonly known as:  PEPCID Take 20 mg by mouth daily.   fexofenadine 180 MG tablet Commonly known as:  ALLEGRA Take 180 mg by mouth as needed for allergies or rhinitis.   furosemide 40 MG tablet Commonly known as:  LASIX Take 1 tablet (40 mg total) by mouth daily as needed. What changed:  See the new instructions.   memantine 7 MG Cp24 24 hr capsule Commonly known as:  NAMENDA XR ONE CAPSULE BY MOUTH DAILY FOR 7 DAYS THEN INCREASE TO TWO CAPSULES DAILY   metoprolol tartrate 50 MG tablet Commonly known as:  LOPRESSOR ONE TABLET BY MOUTH TWICE A DAY   montelukast 10 MG tablet Commonly known as:  SINGULAIR Take 10 mg by mouth daily.   PARoxetine 10 MG tablet Commonly known as:  PAXIL Take 10 mg by mouth daily.       Allergies  Allergen Reactions  .  Asparagus     Nausea, hives  . Eggs Or Egg-Derived Products   . Shellfish Allergy Hives    Consultations:  cardiology   Procedures/Studies: Ct Head Wo Contrast  Result Date: 12/09/2017 CLINICAL DATA:  72 y/o  F; worsening headache since yesterday. EXAM: CT HEAD WITHOUT CONTRAST TECHNIQUE: Contiguous axial images were obtained from the base of the skull through the vertex without intravenous contrast. COMPARISON:  None. FINDINGS: Brain: No evidence of acute infarction, hemorrhage, hydrocephalus, extra-axial collection or mass lesion/mass effect. Small lacunar infarcts are present within the right caudate head and thalamus. Mild chronic microvascular ischemic changes and moderate parenchymal volume loss of the brain for age. Vascular: Extensive calcific atherosclerosis of carotid siphons. Skull: Normal. Negative for fracture or focal lesion. Sinuses/Orbits: Mild ethmoid and maxillary sinus mucosal thickening. Normal aeration of mastoid air cells. Bilateral intra-ocular lens replacement. Other: None. IMPRESSION: 1. No acute intracranial abnormality identified. 2. 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 Electronically Signed   By: Mia Creek  [place]Stratton M.D.   On: 12/09/2017 22:11   Dg Chest Portable 1 View  Result Date: 12/09/2017 CLINICAL DATA:  Productive cough EXAM: PORTABLE CHEST 1 VIEW COMPARISON:  None. FINDINGS: Mild cardiomegaly. Normal vascularity. Postop changes from CABG. No pneumothorax or pleural effusion. Low volumes. IMPRESSION: Cardiomegaly without decompensation. Electronically Signed   By: Marybelle Killings M.D.   On: 12/09/2017 15:20   US Abdomen Limited Ruq  Result Date: 12/12/2017 CLINICAL DATA:  72 year old female with history of abnormal liver enzymes. EXAM: ULTRASOUND ABDOMEN LIMITED RIGHT UPPER QUADRANT COMPARISON:  No priors. FINDINGS: Gallbladder: Multiple small  echogenic foci, many of which demonstrate posterior acoustic shadowing, compatible with small gallstones, measuring up to 5 mm. Gallbladder is not distended. Gallbladder wall thickness is normal at 3 mm. No pericholecystic fluid. Per report from the sonographer, there is no evidence of sonographic Murphy's sign on examination. Common bile duct: Diameter: 3 mm. Liver: No focal lesion identified. Diffusely increased hepatic echogenicity, suggestive of hepatic steatosis. Portal vein is patent on color Doppler imaging with normal direction of blood flow towards the liver. IMPRESSION: 1. Cholelithiasis without evidence of acute cholecystitis at this time. 2. Hepatic steatosis. Electronically Signed   By: Vinnie Langton M.D.   On: 12/12/2017 10:26   (Echo, Carotid, EGD, Colonoscopy, ERCP)    Subjective:   Discharge Exam: Vitals:   12/13/17 0511 12/13/17 0812  BP: 113/68   Pulse: 70 70  Resp: 20 16  Temp: 98.8 F (37.1 C)   SpO2: 95% 98%   Vitals:   12/12/17 2028 12/12/17 2305 12/13/17 0511 12/13/17 0812  BP:  129/89 113/68   Pulse:  74 70 70  Resp:  (!) 24 20 16   Temp:  98.8 F (37.1 C) 98.8 F (37.1 C)   TempSrc:  Oral Oral   SpO2: 96% 96% 95% 98%  Weight:      Height:        General: Pt is alert, awake, not in acute distress Cardiovascular: RRR, S1/S2 +, no rubs, no gallops Respiratory: CTA bilaterally, no wheezing, no rhonchi Abdominal: Soft, NT, ND, bowel sounds + Extremities: no edema, no cyanosis    The results of significant diagnostics from this hospitalization (including imaging, microbiology, ancillary and laboratory) are listed below for reference.     Microbiology: Recent Results (from the past 240 hour(s))  Culture, blood (routine x 2) Call MD if unable to obtain prior to antibiotics being given     Status: None (Preliminary result)   Collection Time: 12/09/17 10:35 PM  Result Value Ref Range Status   Specimen Description BLOOD RIGHT HAND  Final   Special  Requests   Final    BOTTLES DRAWN AEROBIC ONLY Blood Culture adequate volume   Culture   Final    NO GROWTH 2 DAYS Performed at [facility], [address], [place], [place] [address]    Report Status PENDING  Incomplete  Culture, blood (routine x 2) Call MD if unable to obtain prior to antibiotics being given     Status: None (Preliminary result)   Collection Time: 12/09/17 10:40 PM  Result Value Ref Range Status   Specimen Description BLOOD RIGHT HAND  Final   Special Requests   Final    BOTTLES DRAWN AEROBIC ONLY Blood Culture adequate volume   Culture   Final    NO GROWTH 2 DAYS Performed at [facility], [address] [place], [place] [address]    Report Status PENDING  Incomplete  Labs: BNP (last 3 results) Recent Labs    12/09/17 1453  BNP 240.9*   Basic Metabolic Panel: Recent Labs  Lab 12/09/17 1453 12/09/17 2240 12/10/17 0419 12/11/17 0406 12/12/17 0313 12/13/17 0301  NA 137  --  140 135 136 137  K 3.7  --  4.3 4.0 3.9 3.6  CL 105  --  106 99* 102 103  CO2 22  --  26 25 23 24   GLUCOSE 91  --  106* 104* 160* 149*  BUN 11  --  9 9 14 20   CREATININE 0.85  --  1.02* 0.94 0.95 1.07*  CALCIUM 9.6  --  9.2 9.0 9.3 9.6  MG  --  1.9  --   --   --   --    Liver Function Tests: Recent Labs  Lab 12/09/17 1453 12/10/17 0419 12/12/17 0313 12/13/17 0301  AST 52* 49* 94* 115*  ALT 43 41 89* 122*  ALKPHOS 150* 134* 183* 166*  BILITOT 1.9* 1.7* 1.2 0.7  PROT 7.4 6.4* 6.9 7.0  ALBUMIN 4.1 3.3* 3.4* 3.5   No results for input(s): LIPASE, AMYLASE in the last 168 hours. No results for input(s): AMMONIA in the last 168 hours. CBC: Recent Labs  Lab 12/09/17 1453 12/10/17 0419 12/11/17 0406 12/12/17 0313  WBC 14.7* 11.7* 14.6* 12.7*  NEUTROABS  --   --  10.9*  --   HGB 13.9 12.4 12.3 12.7  HCT 41.3 38.9 38.3 39.4  MCV 82.6 85.5 85.1 83.3  PLT 306 277 262 278   Cardiac Enzymes: Recent Labs  Lab 12/09/17 1453 12/09/17 2240  12/10/17 0419 12/10/17 0803  TROPONINI <0.03 <0.03 <0.03 <0.03   BNP: Invalid input(s): POCBNP CBG: No results for input(s): GLUCAP in the last 168 hours. D-Dimer No results for input(s): DDIMER in the last 72 hours. Hgb A1c No results for input(s): HGBA1C in the last 72 hours. Lipid Profile Recent Labs    12/13/17 0301  CHOL 131  HDL 48  LDLCALC 74  TRIG 46  CHOLHDL 2.7   Thyroid function studies No results for input(s): TSH, T4TOTAL, T3FREE, THYROIDAB in the last 72 hours.  Invalid input(s): FREET3 Anemia work up No results for input(s): VITAMINB12, FOLATE, FERRITIN, TIBC, IRON, RETICCTPCT in the last 72 hours. Urinalysis    Component Value Date/Time   COLORURINE YELLOW 12/10/2017 0218   APPEARANCEUR CLEAR 12/10/2017 0218   LABSPEC 1.018 12/10/2017 0218   PHURINE 6.0 12/10/2017 0218   GLUCOSEU NEGATIVE 12/10/2017 0218   HGBUR NEGATIVE 12/10/2017 0218   BILIRUBINUR NEGATIVE 12/10/2017 0218   KETONESUR NEGATIVE 12/10/2017 0218   PROTEINUR NEGATIVE 12/10/2017 0218   NITRITE NEGATIVE 12/10/2017 0218   LEUKOCYTESUR TRACE (A) 12/10/2017 0218   Sepsis Labs Invalid input(s): PROCALCITONIN,  WBC,  LACTICIDVEN Microbiology Recent Results (from the past 240 hour(s))  Culture, blood (routine x 2) Call MD if unable to obtain prior to antibiotics being given     Status: None (Preliminary result)   Collection Time: 12/09/17 10:35 PM  Result Value Ref Range Status   Specimen Description BLOOD RIGHT HAND  Final   Special Requests   Final    BOTTLES DRAWN AEROBIC ONLY Blood Culture adequate volume   Culture   Final    NO GROWTH 2 DAYS Performed at [facility], [place] [address] [place], [place] [address]    Report Status PENDING  Incomplete  Culture, blood (routine x 2) Call MD if unable to obtain prior to antibiotics being  given     Status: None (Preliminary result)   Collection Time: 12/09/17 10:40 PM  Result Value Ref Range Status   Specimen Description BLOOD  RIGHT HAND  Final   Special Requests   Final    BOTTLES DRAWN AEROBIC ONLY Blood Culture adequate volume   Culture   Final    NO GROWTH 2 DAYS Performed at [facility], [address], [place], [place] [address]    Report Status PENDING  Incomplete     Time coordinating discharge:  42 minutes  SIGNED:   Georgette Shell, MD  [facility] 12/13/2017, 9:19 AM Pager   If 7PM-7AM, please contact night-coverage [email or web address] Password [email or web address]

## 2017-12-15 LAB — CULTURE, BLOOD (ROUTINE X 2)
CULTURE: NO GROWTH
Culture: NO GROWTH
SPECIAL REQUESTS: ADEQUATE
Special Requests: ADEQUATE

## 2018-09-17 IMAGING — US US ABDOMEN LIMITED
1 series · 14 of 25 positions shown · non-contrast
Comparison: No priors.

CLINICAL DATA: 72-year-old female with history of abnormal liver
enzymes.

EXAM:
ULTRASOUND ABDOMEN LIMITED RIGHT UPPER QUADRANT

[Series 1: us abdomen limited · 0.19mm/px · 14 of 44 slices shown]
[im 1/44]
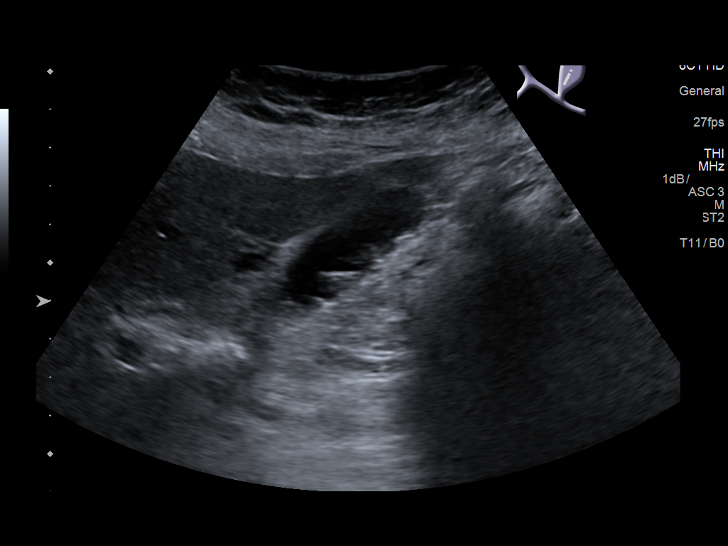
[im 4/44]
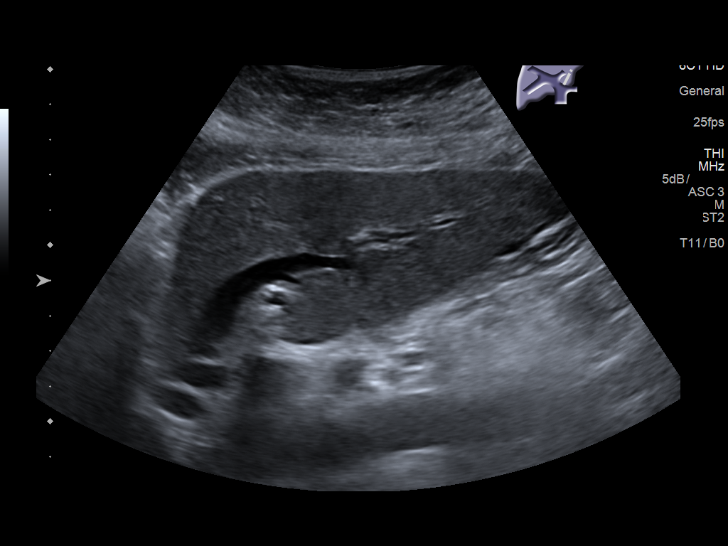
[im 8/44]
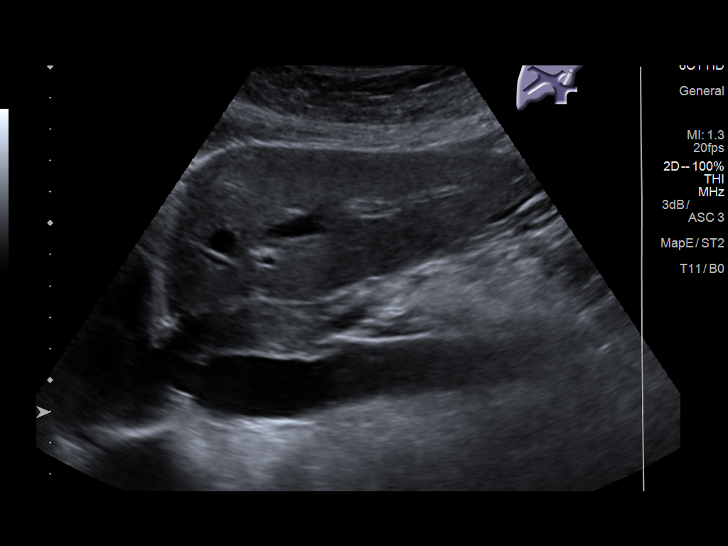
[im 11/44]
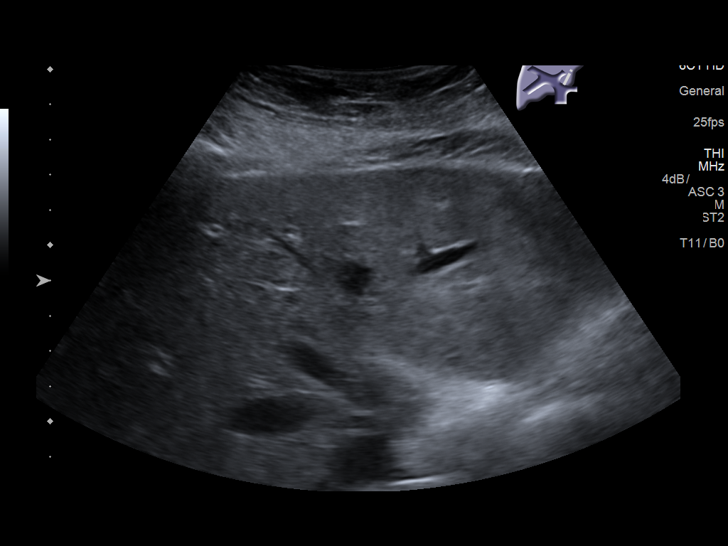
[im 15/44]
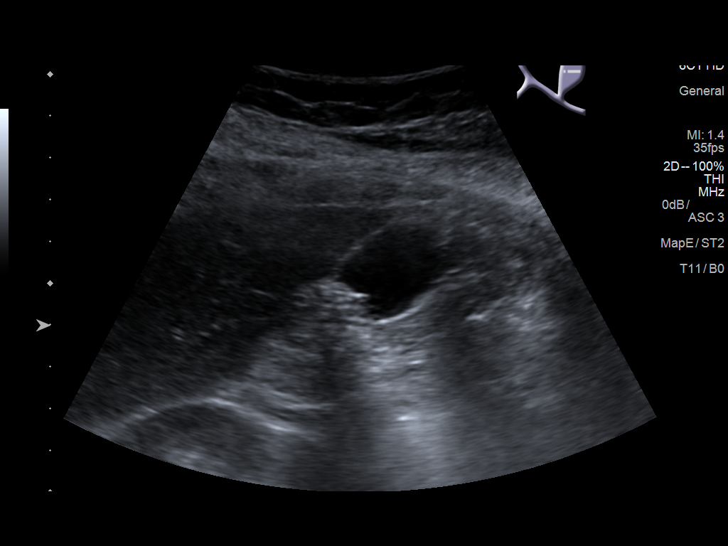
[im 17/44]
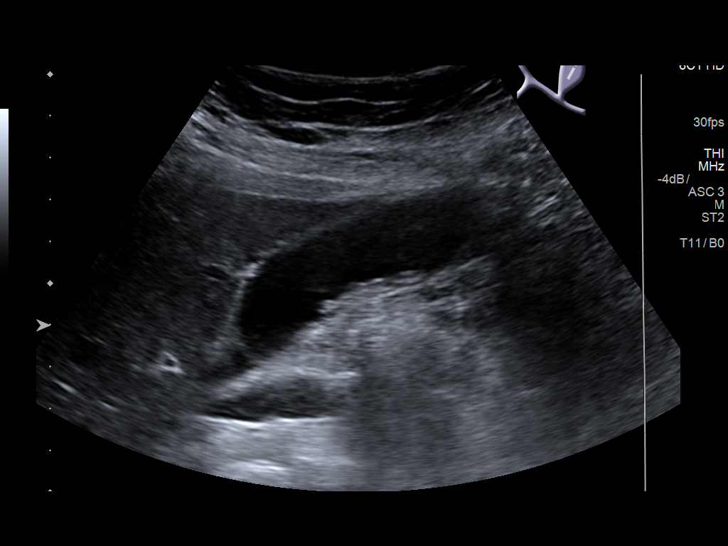
[im 20/44]
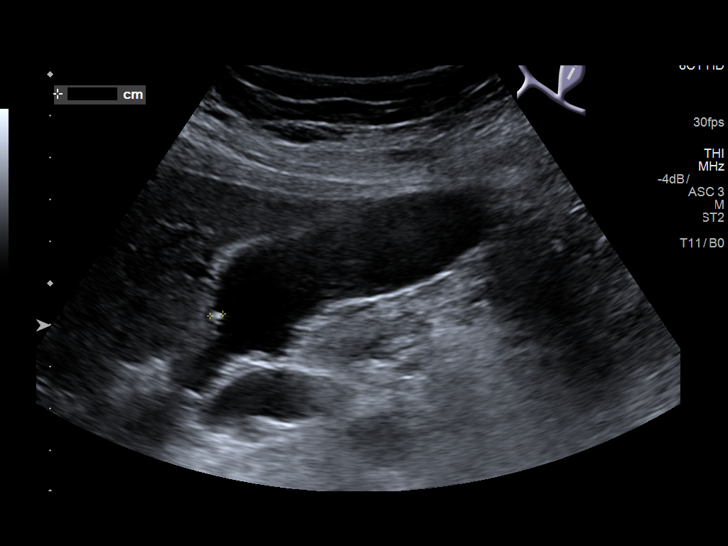
[im 24/44]
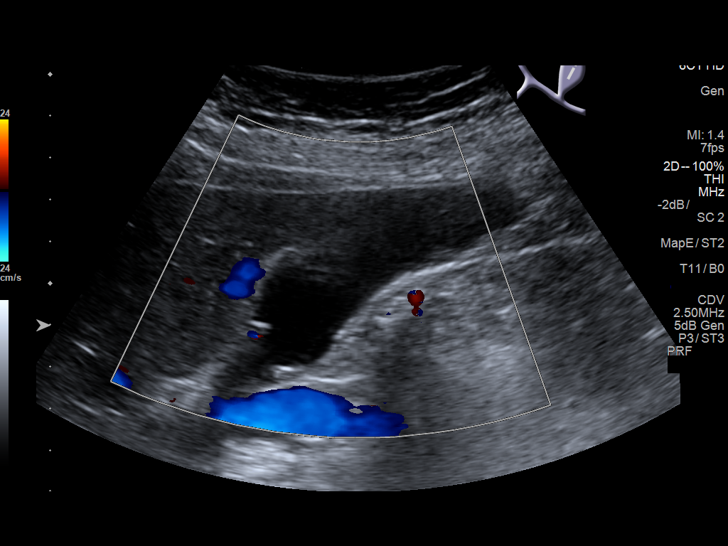
[im 27/44]
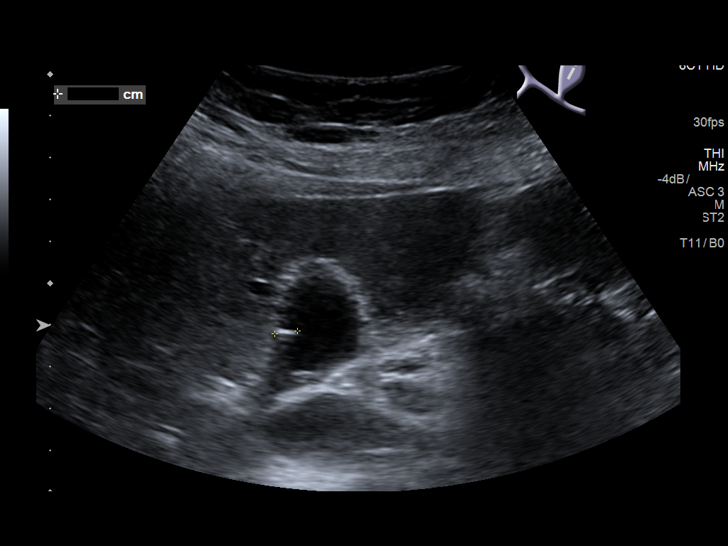
[im 29/44]
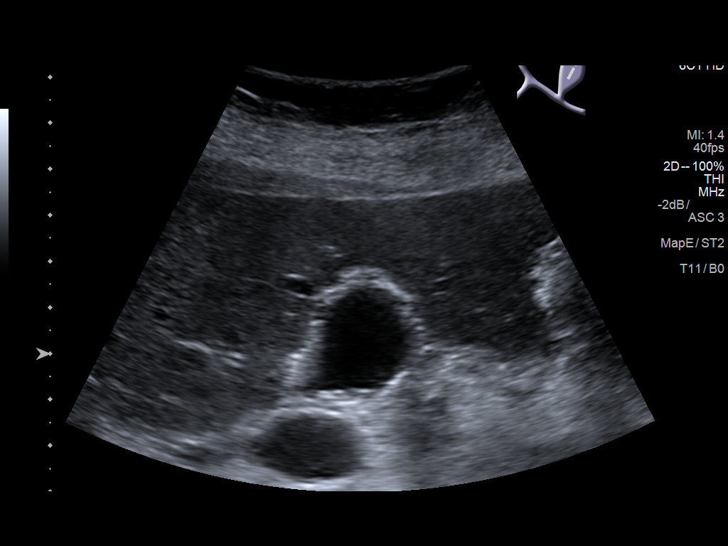
[im 33/44]
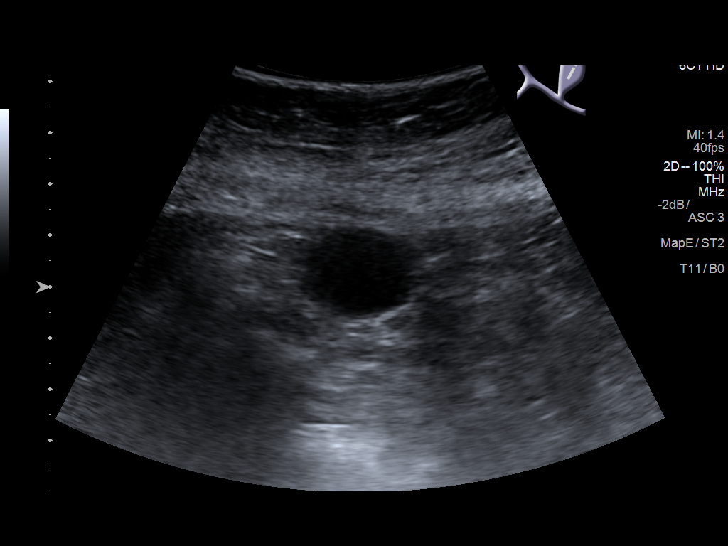
[im 36/44]
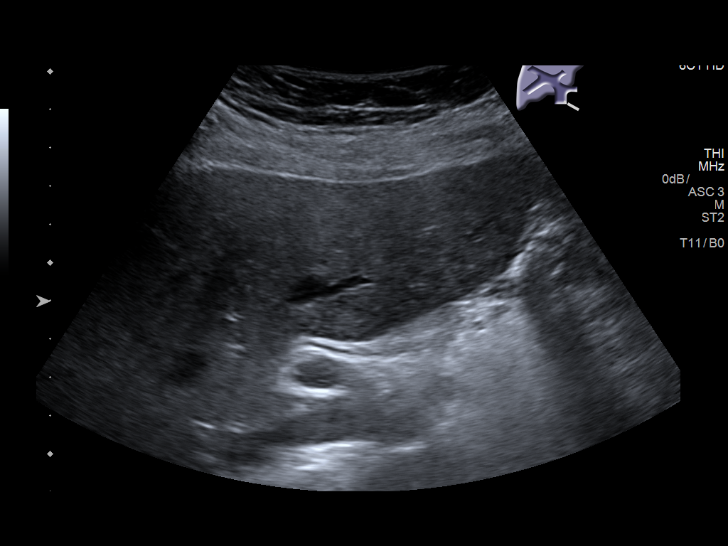
[im 40/44]
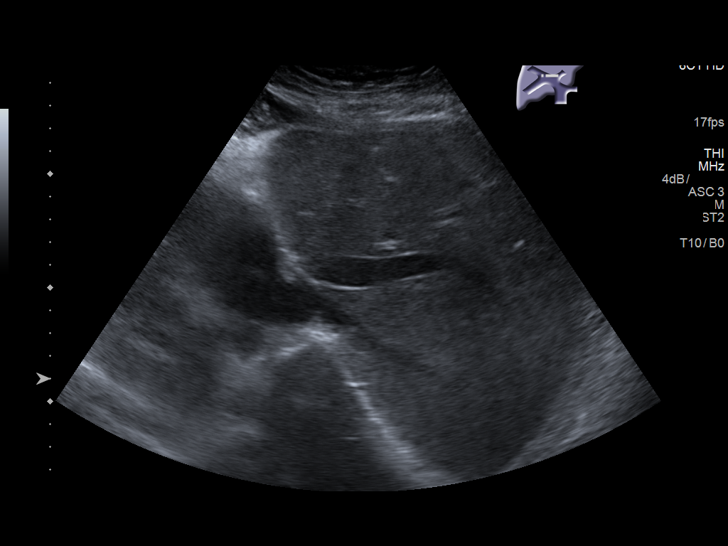
[im 44/44]
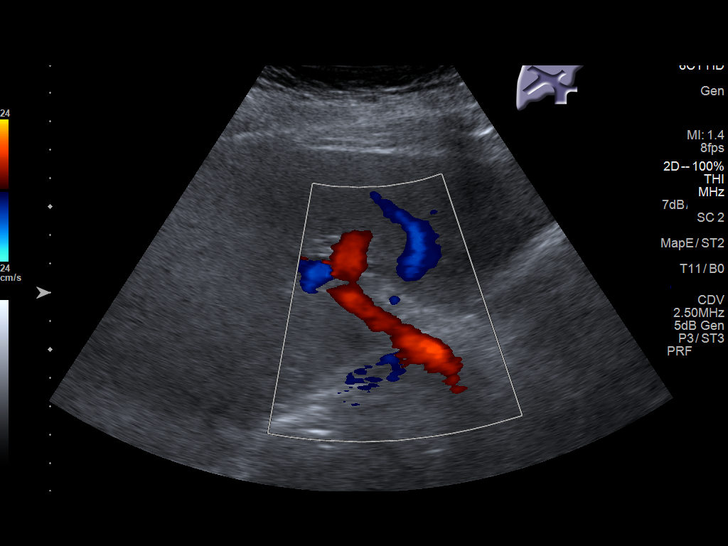

[14 of 25 positions shown; findings below may reference images not displayed]

FINDINGS: Gallbladder:

Multiple small echogenic foci, many of which demonstrate posterior
acoustic shadowing, compatible with small gallstones, measuring up
to 5 mm. Gallbladder is not distended. Gallbladder wall thickness is
normal at 3 mm. No pericholecystic fluid. Per report from the
sonographer, there is no evidence of sonographic Murphy's sign on
examination.

Common bile duct:

Diameter: 3 mm.

Liver:

No focal lesion identified. Diffusely increased hepatic
echogenicity, suggestive of hepatic steatosis. Portal vein is patent
on color Doppler imaging with normal direction of blood flow towards
the liver.
IMPRESSION: 1. Cholelithiasis without evidence of acute cholecystitis at this
time.
2. Hepatic steatosis.

## 2019-01-09 DEATH — deceased

## 2019-08-26 IMAGING — CT CT HEAD W/O CM
4 series · 17 of 47 positions shown, 19 images · non-contrast
Comparison: None.

CLINICAL DATA: 72 y/o  F; worsening headache since yesterday.

EXAM:
CT HEAD WITHOUT CONTRAST
TECHNIQUE: Contiguous axial images were obtained from the base of the skull
through the vertex without intravenous contrast.

[Series 3: head without · axial · non-contrast · 0.41mm/px · z∈[+1012,+1138]mm · 7 of 35 slices shown, 9 images]
[im 5/35  brain]
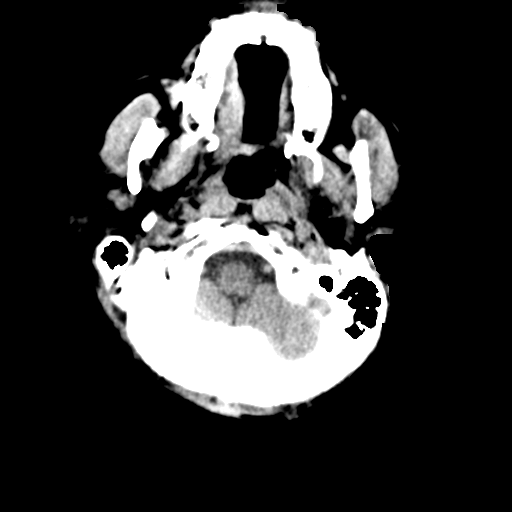
[im 5/35  bone]
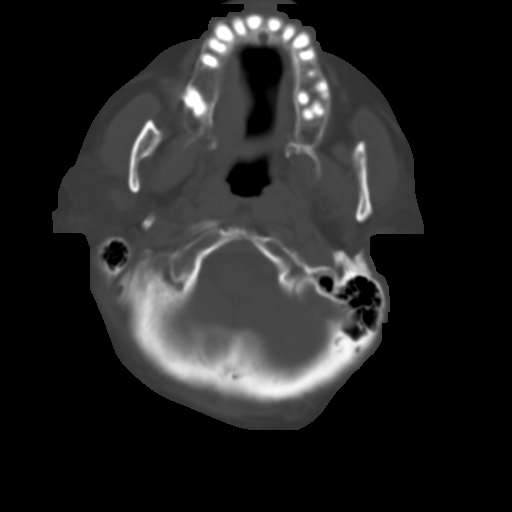
[im 9/35  brain]
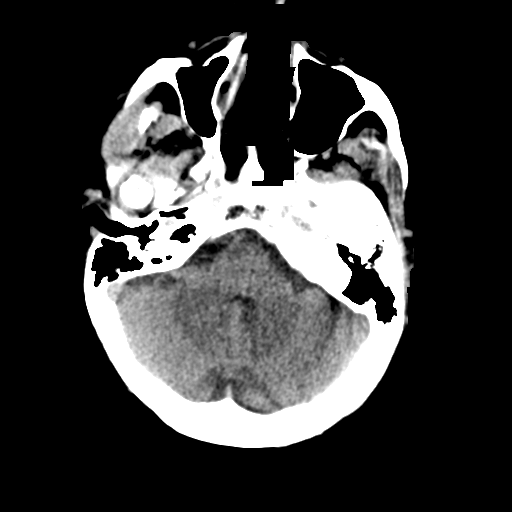
[im 13/35  brain]
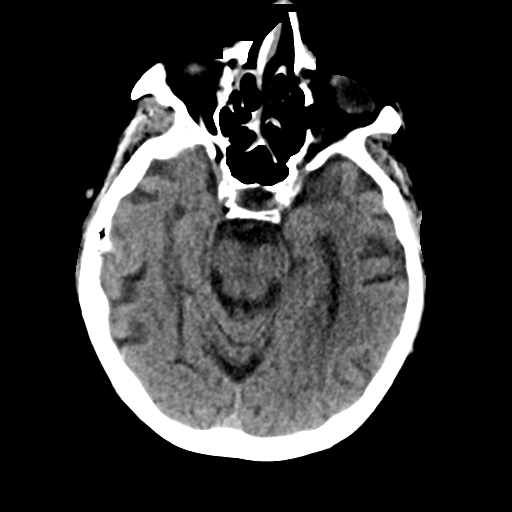
[im 18/35  brain]
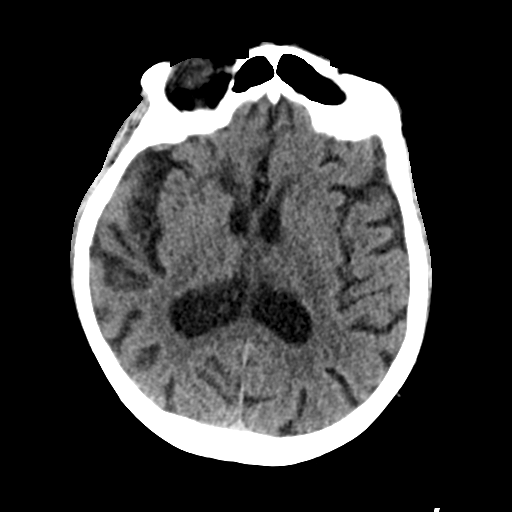
[im 22/35  brain]
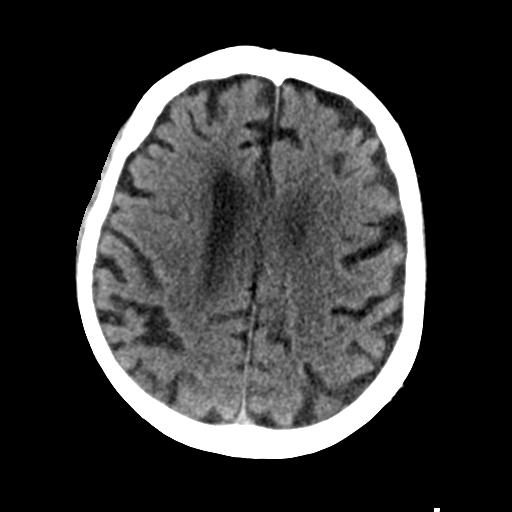
[im 22/35  bone]
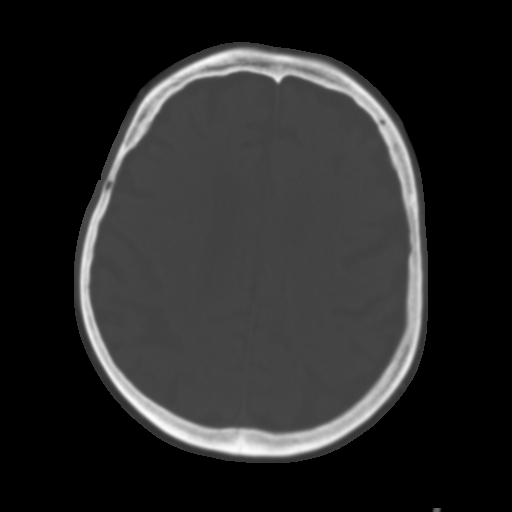
[im 26/35  brain]
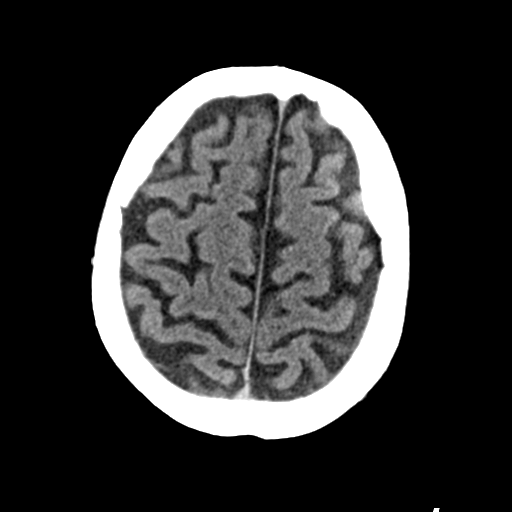
[im 30/35  brain]
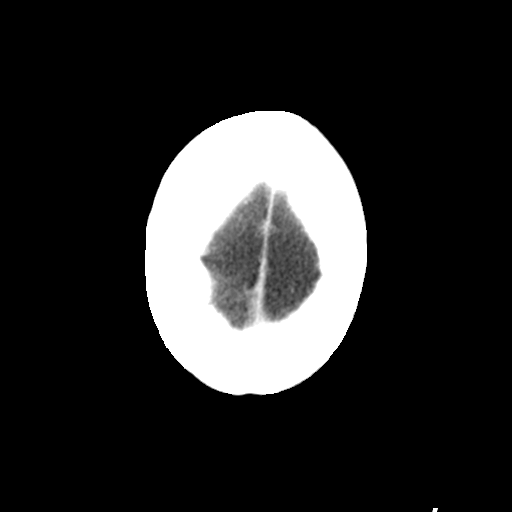

[Series 4: head bone · axial · 0.41mm/px · z∈[+1008,+1070]mm · 4 of 88 slices shown]
[im 9/88  bone]
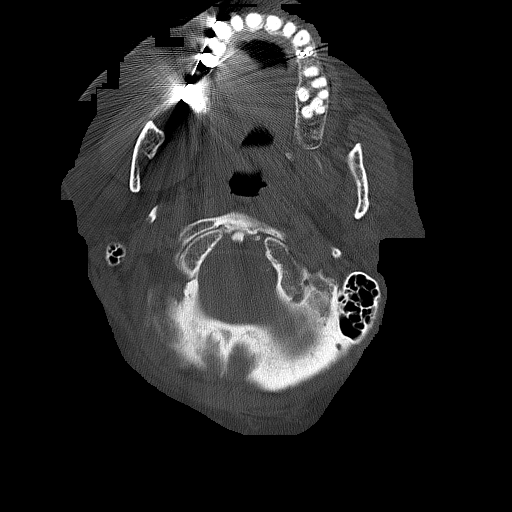
[im 18/88  bone]
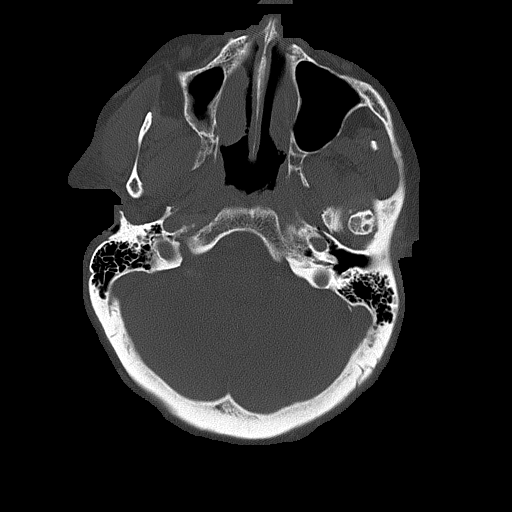
[im 27/88  bone]
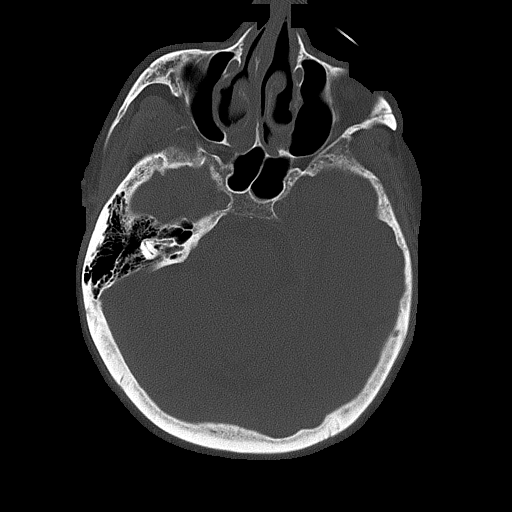
[im 40/88  bone]
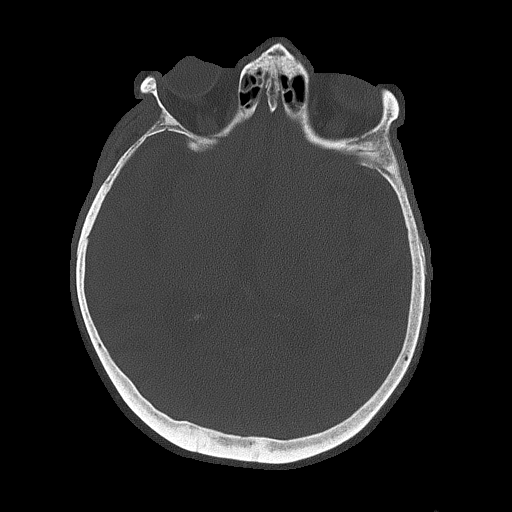

[Series 5: head without cor · coronal · non-contrast · 0.34mm/px · 3 of 66 slices shown]
[im 22/66  brain]
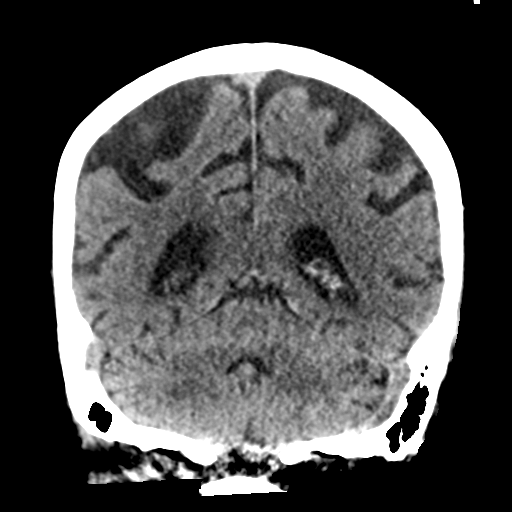
[im 29/66  brain]
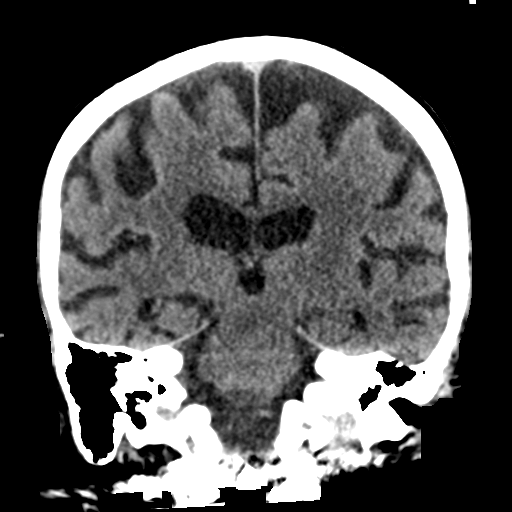
[im 37/66  brain]
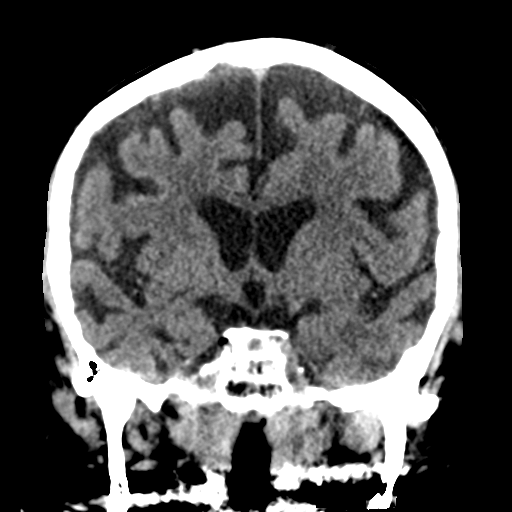

[Series 6: head without sag · sagittal · non-contrast · 0.34mm/px · 3 of 56 slices shown]
[im 19/56  brain]
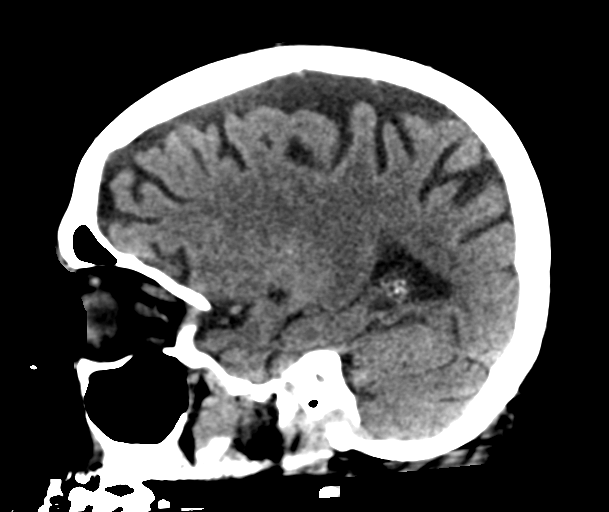
[im 28/56  brain]
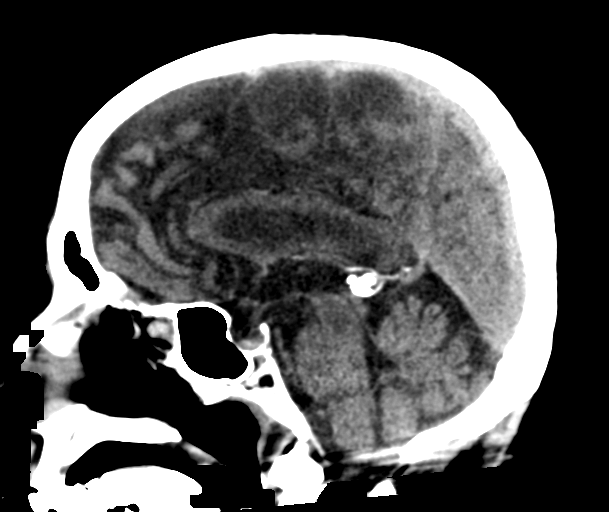
[im 37/56  brain]
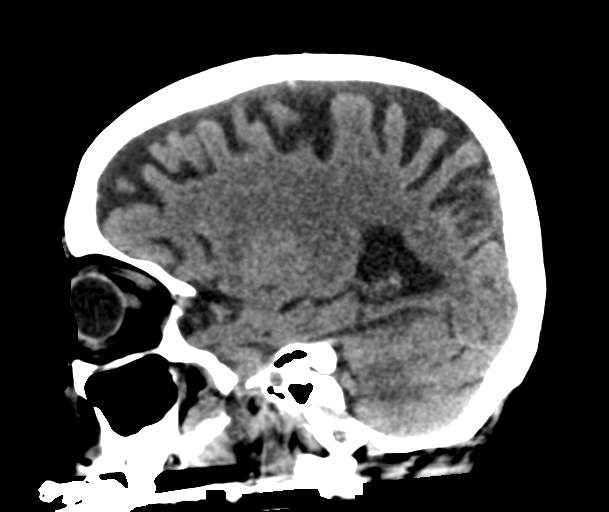

[17 of 47 positions shown; findings below may reference images not displayed]

FINDINGS: Brain: No evidence of acute infarction, hemorrhage, hydrocephalus,
extra-axial collection or mass lesion/mass effect. Small lacunar
infarcts are present within the right caudate head and thalamus.
Mild chronic microvascular ischemic changes and moderate parenchymal
volume loss of the brain for age.

Vascular: Extensive calcific atherosclerosis of carotid siphons.

Skull: Normal. Negative for fracture or focal lesion.

Sinuses/Orbits: Mild ethmoid and maxillary sinus mucosal thickening.
Normal aeration of mastoid air cells. Bilateral intra-ocular lens
replacement.

Other: None.
IMPRESSION: 1. No acute intracranial abnormality identified.
2. Small chronic lacunar infarcts of the basal ganglia, mild chronic
microvascular ischemic changes, and moderate parenchymal volume loss
of the brain.

By: Blesz Rie M.D.
